# Patient Record
Sex: Male | Born: 1960 | Race: White | Hispanic: No | Marital: Married | Smoking: Current some day smoker
Health system: Southern US, Community
[De-identification: ages and names within clinical notes are randomized; demographics above are authoritative.]

## PROBLEM LIST (undated history)

## (undated) DIAGNOSIS — N2581 Secondary hyperparathyroidism of renal origin: Secondary | ICD-10-CM

## (undated) DIAGNOSIS — N189 Chronic kidney disease, unspecified: Secondary | ICD-10-CM

## (undated) DIAGNOSIS — I10 Essential (primary) hypertension: Secondary | ICD-10-CM

## (undated) DIAGNOSIS — S39840A Fracture of corpus cavernosum penis, initial encounter: Secondary | ICD-10-CM

## (undated) DIAGNOSIS — E875 Hyperkalemia: Secondary | ICD-10-CM

## (undated) DIAGNOSIS — Z87442 Personal history of urinary calculi: Secondary | ICD-10-CM

## (undated) DIAGNOSIS — E785 Hyperlipidemia, unspecified: Secondary | ICD-10-CM

## (undated) DIAGNOSIS — M311 Thrombotic microangiopathy, unspecified: Secondary | ICD-10-CM

## (undated) DIAGNOSIS — R0989 Other specified symptoms and signs involving the circulatory and respiratory systems: Secondary | ICD-10-CM

## (undated) DIAGNOSIS — Z992 Dependence on renal dialysis: Secondary | ICD-10-CM

## (undated) DIAGNOSIS — R801 Persistent proteinuria, unspecified: Secondary | ICD-10-CM

## (undated) DIAGNOSIS — K219 Gastro-esophageal reflux disease without esophagitis: Secondary | ICD-10-CM

## (undated) HISTORY — PX: COLONOSCOPY: SHX174

## (undated) HISTORY — PX: TONSILLECTOMY: SUR1361

## (undated) HISTORY — PX: OTHER SURGICAL HISTORY: SHX169

---

## 1987-12-10 DIAGNOSIS — S39840A Fracture of corpus cavernosum penis, initial encounter: Secondary | ICD-10-CM

## 1987-12-10 HISTORY — DX: Fracture of corpus cavernosum penis, initial encounter: S39.840A

## 2005-10-22 ENCOUNTER — Ambulatory Visit: Payer: Self-pay | Admitting: Family Medicine

## 2005-11-05 ENCOUNTER — Ambulatory Visit: Payer: Self-pay | Admitting: Family Medicine

## 2005-11-19 ENCOUNTER — Ambulatory Visit: Payer: Self-pay | Admitting: Family Medicine

## 2005-11-25 ENCOUNTER — Ambulatory Visit: Payer: Self-pay | Admitting: Internal Medicine

## 2006-02-26 ENCOUNTER — Ambulatory Visit: Payer: Self-pay | Admitting: Internal Medicine

## 2006-03-20 ENCOUNTER — Ambulatory Visit: Payer: Self-pay | Admitting: Internal Medicine

## 2008-10-21 ENCOUNTER — Telehealth: Payer: Self-pay | Admitting: Internal Medicine

## 2008-10-21 ENCOUNTER — Encounter (INDEPENDENT_AMBULATORY_CARE_PROVIDER_SITE_OTHER): Payer: Self-pay | Admitting: *Deleted

## 2008-10-28 DIAGNOSIS — I10 Essential (primary) hypertension: Secondary | ICD-10-CM

## 2008-10-31 DIAGNOSIS — I10 Essential (primary) hypertension: Secondary | ICD-10-CM | POA: Insufficient documentation

## 2010-03-28 ENCOUNTER — Emergency Department: Payer: Self-pay | Admitting: Emergency Medicine

## 2013-04-30 ENCOUNTER — Ambulatory Visit: Payer: Self-pay | Admitting: Unknown Physician Specialty

## 2013-05-05 LAB — PATHOLOGY REPORT

## 2014-12-15 ENCOUNTER — Ambulatory Visit: Payer: Self-pay | Admitting: Physician Assistant

## 2018-04-21 ENCOUNTER — Ambulatory Visit: Payer: Self-pay | Admitting: General Surgery

## 2018-04-21 NOTE — H&P (Signed)
PATIENT PROFILE: Ricky Simmons is a 57 y.o. male who presents to the Clinic for consultation at the request of Dr. Carrie Mew for evaluation of hemorrhoids.  PCP:  Sheron Nightingale, PA  HISTORY OF PRESENT ILLNESS: Ricky Simmons reports having hemorrhoids problem since 29. At that moment he had an episode of increased inflamed hemorrhoids, but improved. In the last year and a half patient re started with symptoms. Patient refers main symptoms is bleeding. Also refers pain, leakage of stool, and burning on area. Pain is on perianal area. Pain does not radiates to other part of the body. Pain has been improved with Preparation H. Pain is aggravated with bowel movement. Last colonoscopy 2014 with polyp removed. Patient has appointment to see GI for evaluation of colonoscopy. Even dough patient denies constipation, bowel movement is one of the aggravating factors.    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 Positive for weight loss Allergy and Immunology ROS: negative for - hives  Hematological and Lymphatic ROS: negative for - bleeding problems or bruising, negative for palpable nodes Endocrine ROS: negative for - heat or cold intolerance, hair changes Respiratory ROS: negative for - cough, shortness of breath or wheezing Cardiovascular ROS: no chest pain or palpitations GI ROS: negative for nausea, vomiting, abdominal pain, diarrhea, constipation. Positive for rectal bleeding.  Musculoskeletal ROS: negative for - joint swelling or muscle pain Neurological ROS: negative for - confusion, syncope Dermatological ROS: negative for pruritus and rash Psychiatric: negative for anxiety, depression, difficulty sleeping and memory  loss  MEDICATIONS: CurrentMedications        Current Outpatient Medications  Medication Sig Dispense Refill  . amLODIPine (NORVASC) 10 MG tablet Take 1 tablet (10 mg total) by mouth once daily 90 tablet 1  . carvedilol (COREG) 25 MG tablet Take 1 tablet (25 mg total) by mouth 2 (two) times daily with meals 180 tablet 1  . hydrALAZINE (APRESOLINE) 50 MG tablet Take 1 tablet (50 mg total) by mouth 2 (two) times daily 60 tablet 2  . losartan (COZAAR) 100 MG tablet Take 1 tablet (100 mg total) by mouth once daily 90 tablet 1   No current facility-administered medications for this visit.       ALLERGIES: Patient has no known allergies.  PAST MEDICAL HISTORY:     Past Medical History:  Diagnosis Date  . Hyperlipidemia   . Hypertension     PAST SURGICAL HISTORY:      Past Surgical History:  Procedure Laterality Date  . COLONOSCOPY  04/30/2013   Adenomatous Polyp: CBF 04/2018; Recall Ltr mailed 03/03/2018 (dh)     FAMILY HISTORY:      Family History  Problem Relation Age of Onset  . High blood pressure (Hypertension) Mother   . Rheum arthritis Mother   . High blood pressure (Hypertension) Father   . Myocardial Infarction (Heart attack) Father   . Coronary Artery Disease (Blocked arteries around heart) Father   . Diabetes type I Brother   . Migraines Brother      SOCIAL HISTORY: Social History          Socioeconomic History  . Marital status: Married    Spouse name:  Not on file  . Number of children: Not on file  . Years of education: Not on file  . Highest education level: Not on file  Occupational History  . Not on file  Social Needs  . Financial resource strain: Not on file  . Food insecurity:    Worry: Not on file    Inability: Not on file  . Transportation needs:    Medical: Not on file    Non-medical: Not on file  Tobacco Use  . Smoking status: Current Every Day Smoker    Packs/day: 0.75    Types: Cigarettes  .  Smokeless tobacco: Never Used  Substance and Sexual Activity  . Alcohol use: No    Alcohol/week: 0.0 oz  . Drug use: No  . Sexual activity: Defer  Other Topics Concern  . Not on file  Social History Narrative   Married x 8yr., has two adult step-sons, one Daughter died in 956(trisomy18), and 187yo at home.        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  314yrof college.      No alcohol and smokes 1 ppd x 3015yr     PHYSICAL EXAM:    Vitals:   04/21/18 0911  BP: 126/77  Pulse: 80  Temp: 36.4 C (97.6 F)   Body mass index is 22.04 kg/m. Weight: 78.9 kg (174 lb)   GENERAL: Alert, active, oriented x3  HEENT: Pupils equal reactive to light. Extraocular movements are intact. Sclera clear. Palpebral conjunctiva normal red color.Pharynx clear.  NECK: Supple with no palpable mass and no adenopathy.  LUNGS: Sound clear with no rales rhonchi or wheezes.  HEART: Regular rhythm S1 and S2 without murmur.  ABDOMEN: Soft and depressible, nontender with no palpable mass, no hepatomegaly. See anoscopy report.   RECTAL: adequate rectal tone. No masses, palpable hemorrhoids. No fissures.   EXTREMITIES: Well-developed well-nourished symmetrical with no dependent edema.  NEUROLOGICAL: Awake alert oriented, facial expression symmetrical, moving all extremities.  REVIEW OF DATA: I have reviewed the following data today:      Office Visit on 03/02/2018  Component Date Value  . Hemoglobin A1C 03/02/2018 5.5   . Average Blood Glucose (C* 03/02/2018 111   . Cholesterol, Total 03/02/2018 160   . Triglyceride 03/02/2018 132   . HDL (High Density Lipopr* 03/02/2018 41.3   . LDL (Low Density Lipopro* 03/02/2018 92   . VLDL Cholesterol 03/02/2018 26   . Cholesterol/HDL Ratio 03/02/2018 3.9   . Glucose 03/02/2018 92   . Sodium 03/02/2018 141   . Potassium 03/02/2018 4.7   . Chloride  03/02/2018 110*  . Carbon Dioxide (CO2) 03/02/2018 24.2   . Urea Nitrogen (BUN) 03/02/2018 27*  . Creatinine 03/02/2018 1.7*  . Glomerular Filtration Ra* 03/02/2018 42*  . Calcium 03/02/2018 8.7   . AST  03/02/2018 11   . ALT  03/02/2018 8   . Alk Phos (alkaline Phosp* 03/02/2018 45   . Albumin 03/02/2018 3.9   . Bilirubin, Total 03/02/2018 0.4   . Protein, Total 03/02/2018 6.4   . A/G Ratio 03/02/2018 1.6   . Color 03/02/2018 Yellow   . Clarity 03/02/2018 Clear   . Specific Gravity 03/02/2018 >=1.030   . pH, Urine 03/02/2018 5.5   . Protein, Urinalysis 03/02/2018 100 *  . Glucose, Urinalysis 03/02/2018 Negative   . Ketones, Urinalysis 03/02/2018 Negative   . Blood, Urinalysis  03/02/2018 Negative   . Nitrite, Urinalysis 03/02/2018 Negative   . Leukocyte Esterase, Urin* 03/02/2018 Negative   . White Blood Cells, Urina* 03/02/2018 0-3   . Red Blood Cells, Urinaly* 03/02/2018 None Seen   . Bacteria, Urinalysis 03/02/2018 None Seen   . Squamous Epithelial Cell* 03/02/2018 None Seen   . Thyroid Stimulating Horm* 03/02/2018 2.093   . WBC (White Blood Cell Co* 03/02/2018 8.7   . RBC (Red Blood Cell Coun* 03/02/2018 4.18*  . Hemoglobin 03/02/2018 12.8*  . Hematocrit 03/02/2018 40.7   . MCV (Mean Corpuscular Vo* 03/02/2018 97.4   . MCH (Mean Corpuscular He* 03/02/2018 30.6   . MCHC (Mean Corpuscular H* 03/02/2018 31.4*  . Platelet Count 03/02/2018 185   . RDW-CV (Red Cell Distrib* 03/02/2018 14.0   . MPV (Mean Platelet Volum* 03/02/2018 10.2   . Neutrophils 03/02/2018 5.12   . Lymphocytes 03/02/2018 2.22   . Monocytes 03/02/2018 0.81   . Eosinophils 03/02/2018 0.49   . Basophils 03/02/2018 0.06   . Neutrophil % 03/02/2018 58.8   . Lymphocyte % 03/02/2018 25.5   . Monocyte % 03/02/2018 9.3   . Eosinophil % 03/02/2018 5.6*  . Basophil% 03/02/2018 0.7   . Immature Granulocyte % 03/02/2018 0.1   . Immature Granulocyte Cou* 03/02/2018 0.01     ASSESSMENT: Ricky Simmons is a 57  y.o. male presenting for consultation for Hemorrhoids.    Patient was found on physical exam with finding of internal and external inflamed hemorrhoids. Patient oriented about the diagnosis of hemorrhoids, its function and anatomy. The different treatment alternativeswere discussed (conservative management, office procedures and surgical treatment). Patient was oriented of why it should be started with conservative management (fiber supplement, water intake).  Patient was oriented that the hemorrhoids are a matter of quality of life. Since patient is having significant bleeding from hemorrhoids, pain and has component of both internal and external, surgical hemorrhoidectomy was discussed with patient. Patient refers he prefer to have hemorrhoidectomy since the bleeding is affecting him to much to wait for the to see if fiber and water will decrease the bleeding. Since patient has both, external and internal component, hemorrhoidectomy is a better choice to treat than just banding of the internal hemorrhoids. Patient has to be aware of how much the hemorrhoids are affecting (pain?, bleeding?, discomfort?, prolapse?, leakage?) the quality of life to receive a benefit from a more invasive procedure. If current symptoms does not improve with conservative management discussed with patient, invasive alternatives will be discussed as next step.   PLAN: 1. Internal and external hemorrhoidectomy (46260) 2. CBC, CMP 3. Internal Medicine clearance - Carrie Mew, Miriam 4. Do not take aspirin 5 days before surgery  5. Increase fiber supplementation on diet 6. Adequate fluid intake (6-8 glasses of water a day) 7. Avoid spicy and fatty food 8. Warm baths (Sitz bath) 2 or 3 times a day may help with symptoms 9. Avoid sitting on toilet for a prolonged time 10. Go to the bathroom only when have the urge to have the bowel movement 11. Topical steroids may be used for the relieve of pruritus and inflammation 12.  Avoid constipation or diarrhea (may use stool softener)  Patient verbalized understanding, all questions were answered, and were agreeable with the plan outlined above.   Herbert Pun, MD  Electronically signed by Herbert Pun, MD

## 2018-04-21 NOTE — H&P (View-Only) (Signed)
PATIENT PROFILE: Ricky Simmons is a 57 y.o. male who presents to the Clinic for consultation at the request of Dr. Carrie Mew for evaluation of hemorrhoids.  PCP:  Sheron Nightingale, PA  HISTORY OF PRESENT ILLNESS: [name] reports having hemorrhoids problem since 53. At that moment he had an episode of increased inflamed hemorrhoids, but improved. In the last year and a half patient re started with symptoms. Patient refers main symptoms is bleeding. Also refers pain, leakage of stool, and burning on area. Pain is on perianal area. Pain does not radiates to other part of the body. Pain has been improved with Preparation H. Pain is aggravated with bowel movement. Last colonoscopy 2014 with polyp removed. Patient has appointment to see GI for evaluation of colonoscopy. Even dough patient denies constipation, bowel movement is one of the aggravating factors.    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 Positive for weight loss Allergy and Immunology ROS: negative for - hives  Hematological and Lymphatic ROS: negative for - bleeding problems or bruising, negative for palpable nodes Endocrine ROS: negative for - heat or cold intolerance, hair changes Respiratory ROS: negative for - cough, shortness of breath or wheezing Cardiovascular ROS: no chest pain or palpitations GI ROS: negative for nausea, vomiting, abdominal pain, diarrhea, constipation. Positive for rectal bleeding.  Musculoskeletal ROS: negative for - joint swelling or muscle pain Neurological ROS: negative for - confusion, syncope Dermatological ROS: negative for pruritus and rash Psychiatric: negative for anxiety, depression, difficulty sleeping and memory  loss  MEDICATIONS: CurrentMedications        Current Outpatient Medications  Medication Sig Dispense Refill  . amLODIPine (NORVASC) 10 MG tablet Take 1 tablet (10 mg total) by mouth once daily 90 tablet 1  . carvedilol (COREG) 25 MG tablet Take 1 tablet (25 mg total) by mouth 2 (two) times daily with meals 180 tablet 1  . hydrALAZINE (APRESOLINE) 50 MG tablet Take 1 tablet (50 mg total) by mouth 2 (two) times daily 60 tablet 2  . losartan (COZAAR) 100 MG tablet Take 1 tablet (100 mg total) by mouth once daily 90 tablet 1   No current facility-administered medications for this visit.       ALLERGIES: Patient has no known allergies.  PAST MEDICAL HISTORY:     Past Medical History:  Diagnosis Date  . Hyperlipidemia   . Hypertension     PAST SURGICAL HISTORY:      Past Surgical History:  Procedure Laterality Date  . COLONOSCOPY  04/30/2013   Adenomatous Polyp: CBF 04/2018; Recall Ltr mailed 03/03/2018 (dh)     FAMILY HISTORY:      Family History  Problem Relation Age of Onset  . High blood pressure (Hypertension) Mother   . Rheum arthritis Mother   . High blood pressure (Hypertension) Father   . Myocardial Infarction (Heart attack) Father   . Coronary Artery Disease (Blocked arteries around heart) Father   . Diabetes type I Brother   . Migraines Brother      SOCIAL HISTORY: Social History          Socioeconomic History  . Marital status: Married    Spouse name:  Not on file  . Number of children: Not on file  . Years of education: Not on file  . Highest education level: Not on file  Occupational History  . Not on file  Social Needs  . Financial resource strain: Not on file  . Food insecurity:    Worry: Not on file    Inability: Not on file  . Transportation needs:    Medical: Not on file    Non-medical: Not on file  Tobacco Use  . Smoking status: Current Every Day Smoker    Packs/day: 0.75    Types: Cigarettes  .  Smokeless tobacco: Never Used  Substance and Sexual Activity  . Alcohol use: No    Alcohol/week: 0.0 oz  . Drug use: No  . Sexual activity: Defer  Other Topics Concern  . Not on file  Social History Narrative   Married x 61yr., has two adult step-sons, one Daughter died in 924(trisomy18), and 196yo at home.        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  380yrof college.      No alcohol and smokes 1 ppd x 3074yr     PHYSICAL EXAM:    Vitals:   04/21/18 0911  BP: 126/77  Pulse: 80  Temp: 36.4 C (97.6 F)   Body mass index is 22.04 kg/m. Weight: 78.9 kg (174 lb)   GENERAL: Alert, active, oriented x3  HEENT: Pupils equal reactive to light. Extraocular movements are intact. Sclera clear. Palpebral conjunctiva normal red color.Pharynx clear.  NECK: Supple with no palpable mass and no adenopathy.  LUNGS: Sound clear with no rales rhonchi or wheezes.  HEART: Regular rhythm S1 and S2 without murmur.  ABDOMEN: Soft and depressible, nontender with no palpable mass, no hepatomegaly. See anoscopy report.   RECTAL: adequate rectal tone. No masses, palpable hemorrhoids. No fissures.   EXTREMITIES: Well-developed well-nourished symmetrical with no dependent edema.  NEUROLOGICAL: Awake alert oriented, facial expression symmetrical, moving all extremities.  REVIEW OF DATA: I have reviewed the following data today:      Office Visit on 03/02/2018  Component Date Value  . Hemoglobin A1C 03/02/2018 5.5   . Average Blood Glucose (C* 03/02/2018 111   . Cholesterol, Total 03/02/2018 160   . Triglyceride 03/02/2018 132   . HDL (High Density Lipopr* 03/02/2018 41.3   . LDL (Low Density Lipopro* 03/02/2018 92   . VLDL Cholesterol 03/02/2018 26   . Cholesterol/HDL Ratio 03/02/2018 3.9   . Glucose 03/02/2018 92   . Sodium 03/02/2018 141   . Potassium 03/02/2018 4.7   . Chloride  03/02/2018 110*  . Carbon Dioxide (CO2) 03/02/2018 24.2   . Urea Nitrogen (BUN) 03/02/2018 27*  . Creatinine 03/02/2018 1.7*  . Glomerular Filtration Ra* 03/02/2018 42*  . Calcium 03/02/2018 8.7   . AST  03/02/2018 11   . ALT  03/02/2018 8   . Alk Phos (alkaline Phosp* 03/02/2018 45   . Albumin 03/02/2018 3.9   . Bilirubin, Total 03/02/2018 0.4   . Protein, Total 03/02/2018 6.4   . A/G Ratio 03/02/2018 1.6   . Color 03/02/2018 Yellow   . Clarity 03/02/2018 Clear   . Specific Gravity 03/02/2018 >=1.030   . pH, Urine 03/02/2018 5.5   . Protein, Urinalysis 03/02/2018 100 *  . Glucose, Urinalysis 03/02/2018 Negative   . Ketones, Urinalysis 03/02/2018 Negative   . Blood, Urinalysis  03/02/2018 Negative   . Nitrite, Urinalysis 03/02/2018 Negative   . Leukocyte Esterase, Urin* 03/02/2018 Negative   . White Blood Cells, Urina* 03/02/2018 0-3   . Red Blood Cells, Urinaly* 03/02/2018 None Seen   . Bacteria, Urinalysis 03/02/2018 None Seen   . Squamous Epithelial Cell* 03/02/2018 None Seen   . Thyroid Stimulating Horm* 03/02/2018 2.093   . WBC (White Blood Cell Co* 03/02/2018 8.7   . RBC (Red Blood Cell Coun* 03/02/2018 4.18*  . Hemoglobin 03/02/2018 12.8*  . Hematocrit 03/02/2018 40.7   . MCV (Mean Corpuscular Vo* 03/02/2018 97.4   . MCH (Mean Corpuscular He* 03/02/2018 30.6   . MCHC (Mean Corpuscular H* 03/02/2018 31.4*  . Platelet Count 03/02/2018 185   . RDW-CV (Red Cell Distrib* 03/02/2018 14.0   . MPV (Mean Platelet Volum* 03/02/2018 10.2   . Neutrophils 03/02/2018 5.12   . Lymphocytes 03/02/2018 2.22   . Monocytes 03/02/2018 0.81   . Eosinophils 03/02/2018 0.49   . Basophils 03/02/2018 0.06   . Neutrophil % 03/02/2018 58.8   . Lymphocyte % 03/02/2018 25.5   . Monocyte % 03/02/2018 9.3   . Eosinophil % 03/02/2018 5.6*  . Basophil% 03/02/2018 0.7   . Immature Granulocyte % 03/02/2018 0.1   . Immature Granulocyte Cou* 03/02/2018 0.01     ASSESSMENT: Mr. Havey is a 57  y.o. male presenting for consultation for Hemorrhoids.    Patient was found on physical exam with finding of internal and external inflamed hemorrhoids. Patient oriented about the diagnosis of hemorrhoids, its function and anatomy. The different treatment alternativeswere discussed (conservative management, office procedures and surgical treatment). Patient was oriented of why it should be started with conservative management (fiber supplement, water intake).  Patient was oriented that the hemorrhoids are a matter of quality of life. Since patient is having significant bleeding from hemorrhoids, pain and has component of both internal and external, surgical hemorrhoidectomy was discussed with patient. Patient refers he prefer to have hemorrhoidectomy since the bleeding is affecting him to much to wait for the to see if fiber and water will decrease the bleeding. Since patient has both, external and internal component, hemorrhoidectomy is a better choice to treat than just banding of the internal hemorrhoids. Patient has to be aware of how much the hemorrhoids are affecting (pain?, bleeding?, discomfort?, prolapse?, leakage?) the quality of life to receive a benefit from a more invasive procedure. If current symptoms does not improve with conservative management discussed with patient, invasive alternatives will be discussed as next step.   PLAN: 1. Internal and external hemorrhoidectomy (46260) 2. CBC, CMP 3. Internal Medicine clearance - Carrie Mew, Miriam 4. Do not take aspirin 5 days before surgery  5. Increase fiber supplementation on diet 6. Adequate fluid intake (6-8 glasses of water a day) 7. Avoid spicy and fatty food 8. Warm baths (Sitz bath) 2 or 3 times a day may help with symptoms 9. Avoid sitting on toilet for a prolonged time 10. Go to the bathroom only when have the urge to have the bowel movement 11. Topical steroids may be used for the relieve of pruritus and inflammation 12.  Avoid constipation or diarrhea (may use stool softener)  Patient verbalized understanding, all questions were answered, and were agreeable with the plan outlined above.   Herbert Pun, MD  Electronically signed by Herbert Pun, MD

## 2018-04-29 ENCOUNTER — Other Ambulatory Visit: Payer: Self-pay

## 2018-04-29 ENCOUNTER — Encounter
Admission: RE | Admit: 2018-04-29 | Discharge: 2018-04-29 | Disposition: A | Discharge status: Home / Self Care | Payer: Managed Care, Other (non HMO) | Source: Ambulatory Visit | Attending: General Surgery | Admitting: General Surgery

## 2018-04-29 DIAGNOSIS — Z01810 Encounter for preprocedural cardiovascular examination: Secondary | ICD-10-CM | POA: Diagnosis not present

## 2018-04-29 HISTORY — DX: Personal history of urinary calculi: Z87.442

## 2018-04-29 HISTORY — DX: Essential (primary) hypertension: I10

## 2018-04-29 HISTORY — DX: Gastro-esophageal reflux disease without esophagitis: K21.9

## 2018-04-29 NOTE — Patient Instructions (Signed)
Your procedure is scheduled on: 05-07-18 THURSDAY Report to Same Day Surgery 2nd floor medical mall [facility] Entrance-take elevator on left to 2nd floor.  Check in with surgery information desk.) To find out your arrival time please call [phone] between 1PM - 3PM on 05-06-18 [facility]  Remember: Instructions that are not followed completely may result in serious medical risk, up to and including death, or upon the discretion of your surgeon and anesthesiologist your surgery may need to be rescheduled.    _x___ 1. Do not eat food after midnight the night before your procedure. NO GUM OR CANDY AFTER MIDNIGHT.  You may drink clear liquids up to 2 hours before you are scheduled to arrive at the hospital for your procedure.  Do not drink clear liquids within 2 hours of your scheduled arrival to the hospital.  Clear liquids include  --Water or Apple juice without pulp  --Clear carbohydrate beverage such as ClearFast or Gatorade  --Black Coffee or Clear Tea (No milk, no creamers, do not add anything to the coffee or Tea     __x__ 2. No Alcohol for 24 hours before or after surgery.   __x__3. No Smoking or e-cigarettes for 24 prior to surgery.  Do not use any chewable tobacco products for at least 6 hour prior to surgery   ____  4. Bring all medications with you on the day of surgery if instructed.    __x__ 5. Notify your doctor if there is any change in your medical condition     (cold, fever, infections).    x___6. On the morning of surgery brush your teeth with toothpaste and water.  You may rinse your mouth with mouth wash if you wish.  Do not swallow any toothpaste or mouthwash.   Do not wear jewelry, make-up, hairpins, clips or nail polish.  Do not wear lotions, powders, or perfumes. You may wear deodorant.  Do not shave 48 hours prior to surgery. Men may shave face and neck.  Do not bring valuables to the hospital.    [facility] is not responsible for any belongings or  valuables.               Contacts, dentures or bridgework may not be worn into surgery.  Leave your suitcase in the car. After surgery it may be brought to your room.  For patients admitted to the hospital, discharge time is determined by your treatment team.  _  Patients discharged the day of surgery will not be allowed to drive home.  You will need someone to drive you home and stay with you the night of your procedure.    Please read over the following fact sheets that you were given:   [facility] Preparing for Surgery and or MRSA Information   _x___ TAKE THE FOLLOWING MEDICATION THE MORNING OF SURGERY . These include:  1. AMLODIPINE (NORVASC)  2. COREG (CARVEDILOL)  3. HYDRALAZINE (APRESOLINE)  4.  5.  6.  ____Fleets enema or Magnesium Citrate as directed.   ____ Use CHG Soap or sage wipes as directed on instruction sheet   ____ Use inhalers on the day of surgery and bring to hospital day of surgery  ____ Stop Metformin and Janumet 2 days prior to surgery.    ____ Take 1/2 of usual insulin dose the night before surgery and none on the morning surgery.   ____ Follow recommendations from Cardiologist, Pulmonologist or PCP regarding stopping Aspirin, Coumadin, Plavix ,Eliquis, Effient, or Pradaxa, and  Pletal.  X____Stop Anti-inflammatories such as Advil, Aleve, Ibuprofen, Motrin, Naproxen, Naprosyn, Goodies powders or aspirin products NOW-OK to take Tylenol   ____ Stop supplements until after surgery.    ____ Bring C-Pap to the hospital.

## 2018-04-30 ENCOUNTER — Other Ambulatory Visit: Payer: Self-pay

## 2018-04-30 ENCOUNTER — Encounter
Admission: RE | Admit: 2018-04-30 | Discharge: 2018-04-30 | Disposition: A | Discharge status: Home / Self Care | Payer: Managed Care, Other (non HMO) | Source: Ambulatory Visit | Attending: General Surgery | Admitting: General Surgery

## 2018-05-06 ENCOUNTER — Encounter: Payer: Self-pay | Admitting: *Deleted

## 2018-05-06 MED ORDER — CIPROFLOXACIN IN D5W 400 MG/200ML IV SOLN
400.0000 mg | INTRAVENOUS | Status: AC
  Administered 2018-05-07: 400 mg via INTRAVENOUS

## 2018-05-06 MED ORDER — METRONIDAZOLE IN NACL 5-0.79 MG/ML-% IV SOLN
500.0000 mg | INTRAVENOUS | Status: AC
  Administered 2018-05-07: 500 mg via INTRAVENOUS
  Filled 2018-05-06: qty 100

## 2018-05-06 NOTE — Pre-Procedure Instructions (Signed)
Progress Notes - documented in this encounter  Sheron Nightingale, [place] - 03/02/2018 1:30 PM EDT Formatting of this note might be different from the original. Ricky Simmons is a 57 y.o. male here for reck patient visit to discuss: Chief Complaint  Patient presents with  . Follow-up  blood pressure   History of Present Illness:  57 yo male back for reck.   Hypertension- Has been off Amlodipine and not taking Carvedilol twice daily,now just taking once daily for unclear reasons.   Weight loss- Has lost weight intentionally. Down 12 lbs since last visit. Says mood is stable. Not exercising.   Rectal bleeding- would like eval of hemorrhoids. Says he has large volume bleeding even without BM. This will soak his pants at times. Says he has large hemorrhoids. Has prior hx/o adenomatous polyp on colonoscopy.   Elevated Creatinine- not checked recently.  Past Medical History:   Past Medical History:  Diagnosis Date  . Hyperlipidemia, unspecified  . Hypertension   Past Surgical History:   Past Surgical History:  Procedure Laterality Date  . COLONOSCOPY 04/30/13  Adenomatous polyp   Allergies:  No Known Allergies  Current Medications:   Prior to Admission medications  Medication Sig Taking? Last Dose  amLODIPine (NORVASC) 10 MG tablet Take 1 tablet (10 mg total) by mouth once daily. Yes Taking  carvedilol (COREG) 25 MG tablet Take 1 tablet (25 mg total) by mouth 2 (two) times daily with meals Yes Taking  hydrALAZINE (APRESOLINE) 50 MG tablet Take 1 tablet (50 mg total) by mouth 2 (two) times daily. Yes Taking  losartan (COZAAR) 100 MG tablet Take 1 tablet (100 mg total) by mouth once daily Yes Taking   Family History:   Family History  Problem Relation Age of Onset  . High blood pressure (Hypertension) Mother  . Rheum arthritis Mother  . High blood pressure (Hypertension) Father  . Myocardial Infarction (Heart attack) Father  . Coronary Artery Disease (Blocked  arteries around heart) Father  . Diabetes type I Brother  . Migraines Brother   Social History:   Social History   Socioeconomic History  . Marital status: Married  Spouse name: Not on file  . Number of children: Not on file  . Years of education: Not on file  . Highest education level: Not on file  Occupational History  . Not on file  Social Needs  . Financial resource strain: Not on file  . Food insecurity:  Worry: Not on file  Inability: Not on file  . Transportation needs:  Medical: Not on file  Non-medical: Not on file  Tobacco Use  . Smoking status: Current Every Day Smoker  Packs/day: 0.75  Types: Cigarettes  . Smokeless tobacco: Never Used  Substance and Sexual Activity  . Alcohol use: No  Alcohol/week: 0.0 oz  . Drug use: No  . Sexual activity: Defer  Lifestyle  . Physical activity:  Days per week: Not on file  Minutes per session: Not on file  . Stress: Not on file  Relationships  . Social connections:  Talks on phone: Not on file  Gets together: Not on file  Attends religious service: Not on file  Active member of club or organization: Not on file  Attends meetings of clubs or organizations: Not on file  Relationship status: Not on file  Other Topics Concern  . Not on file  Social History Narrative  Married x 4yr., has two adult step-sons, one Daughter died in [phone]([id number]), and 169  yo at home.   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 HS and college athlete, baseball. 38yr of college.   No alcohol and smokes 1 ppd x 372yr   Review of Systems:   Per HPI  Vitals:   Vitals:  03/02/18 1326  BP: (!) 178/102  BP Location: Right upper arm  Patient Position: Sitting  BP Cuff Size: Adult  Pulse: 69  SpO2: 97%  Weight: 79.1 kg (174 lb 6.4 oz)  Height: 189.2 cm (6' 2.5")   Body mass index is 22.09 kg/m.  Physical Exam:   Wt Readings from Last 3 Encounters:  03/02/18 79.1 kg (174 lb 6.4 oz)    07/24/17 84.4 kg (186 lb)  06/20/17 81.5 kg (179 lb 9.6 oz)   Assessment and Plan:   1. Hypertension, essential Significantly elevated. Restart medications as prescribed. Will send refills.   2. Adenomatous polyp Now with rectal bleeding and could be related to hemorrhoids vs. Other cause. Refer to GI for consideration of colonoscopy.  - Ambulatory Referral to Gastroenterology  3. Prediabetes Repeat labs.  - Hemoglobin A1C - Lipid Panel w/calc LDL  4. Mixed hyperlipidemia Check labs.   5. Rectal bleeding Refer to both GI and surgery, check cbc to evaluate.  - Ambulatory Referral to Gastroenterology - Ambulatory Referral to General Surgery  6. Hemorrhoids, internal, with bleeding - Ambulatory Referral to General Surgery  7. Essential hypertension - Comprehensive Metabolic Panel (CMP) - Thyroid Stimulating-Hormone (TSH) - CBC w/auto Differential (5 Part)  8. CKD (chronic kidney disease) stage 3, GFR 30-59 ml/min (CMS-HCC) - Comprehensive Metabolic Panel (CMP) - Urinalysis w/Microscopic  Will need close follow up with issues and weight loss. follow up in 4-6 weeks for recheck.  MiPaulita CradlePA-C  Patient received an After Visit Summary     Electronically signed by McSheron NightingalePACumminsvillet 03/05/2018 5:54 PM EDT     Plan of Treatment - documented as of this encounter  Upcoming Encounters Upcoming Encounters  Date Type Specialty Care Team Description  05/21/2018 [place] MillEd[place]ia[place] [phone])    07/27/2018 Office Visit Internal Medicine McPaulita CradlelPlainfieldPAOliver Springs[place]reeleyKe[place]ville[place] [phone])     Scheduled Referrals Scheduled Referrals  Name Type Priority Associated Diagnoses Order Schedule  Ambulatory Referral to Gastroenterology Outpatient Referral  Routine Adenomatous polyp  Rectal bleeding  Ordered: 03/02/2018  Ambulatory Referral to General Surgery Outpatient Referral ASAP Rectal bleeding  Hemorrhoids, internal, with bleeding  Ordered: 03/02/2018   Goals - documented as of this encounter  Goal Patient Goal Type Associated Problems Recent Progress Patient-Stated? AuChief Strategy OfficerStop E-Cigarettes  General   Yes EvYvonne KendallCMA  Reduce Blood Pressure Elevation  General   Yes EvYvonne KendallCMA  Take My Medications Properly  General   Yes EvYvonne KendallCMA   Procedures - documented in this encounter  Procedure Name Priority Date/Time Associated Diagnosis Comments  CBC W/AUTO DIFFERENTIAL (5 PART DIFF) -[facility], [facility] Routine 03/02/2018 2:16 PM EDT Essential hypertension  Results for this procedure are in the results section.   TSH (THYROID STIMULATING HORMONE) - [facility]FFIL[facility], [facility] Routine 03/02/2018 2:16 PM EDT Essential hypertension  Results for this procedure are in the results section.   HEMOGLOBIN A1C - [facility]IL[facility], [facility] Routine 03/02/2018 2:16 PM  EDT Prediabetes  Results for this procedure are in the results section.   URINALYSIS W/MICROSCOPIC - [facility], [facility] Routine 03/02/2018 2:16 PM EDT CKD (chronic kidney disease) stage 3, GFR 30-59 ml/min (CMS-HCC)  Results for this procedure are in the results section.   LIPID PANEL W/CALC LDL - [facility] AFFILIATE, [facility] Routine 03/02/2018 2:16 PM EDT Prediabetes  Results for this procedure are in the results section.   COMPREHENSIVE METABOLIC PANEL (CMP) - [facility] AFFILIATE, [facility] Routine 03/02/2018 2:16 PM EDT Essential hypertension  CKD (chronic kidney disease) stage 3, GFR 30-59 ml/min (CMS-HCC)  Results for this procedure are in the results section.    Lab Results - documented in this encounter  Table of Contents for Lab Results  CBC w/auto Differential (5 Part) (03/02/2018 2:16 PM EDT)  Thyroid Stimulating-Hormone (TSH)  (03/02/2018 2:16 PM EDT)  Urinalysis w/Microscopic (03/02/2018 2:16 PM EDT)  Comprehensive Metabolic Panel (CMP) (68/61/6837 2:16 PM EDT)  Lipid Panel w/calc LDL (03/02/2018 2:16 PM EDT)  Hemoglobin A1C (03/02/2018 2:16 PM EDT)     CBC w/auto Differential (5 Part) (03/02/2018 2:16 PM EDT) CBC w/auto Differential (5 Part) (03/02/2018 2:16 PM EDT)  Component Value Ref Range Performed At Pathologist Signature  WBC (White Blood Cell Count) 8.7 4.1 - 10.2 10^3/uL [place] - LAB   RBC (Red Blood Cell Count) 4.18 (L) 4.69 - 6.13 10^6/uL [facility]   Hemoglobin 12.8 (L) 14.1 - 18.1 gm/dL [facility]   Hematocrit 40.7 40.0 - 52.0 % [facility]   MCV (Mean Corpuscular Volume) 97.4 80.0 - 100.0 fl [place] - [facility]   MCH (Mean Corpuscular Hemoglobin) 30.6 27.0 - 31.2 pg [facility]   MCHC (Mean Corpuscular Hemoglobin Concentration) 31.4 (L) 32.0 - 36.0 gm/dL [facility]   Platelet Count 185 150 - 450 10^3/uL [place] - [facility]   RDW-CV (Red Cell Distribution Width) 14.0 11.6 - 14.8 % [facility]   MPV (Mean Platelet Volume) 10.2 9.4 - 12.4 fl [facility]   Neutrophils 5.12 1.50 - 7.80 10^3/uL [place] - LAB   Lymphocytes 2.22 1.00 - 3.60 10^3/uL [facility]   Monocytes 0.81 0.00 - 1.50 10^3/uL [facility]   Eosinophils 0.49 0.00 - 0.55 10^3/uL [facility]   Basophils 0.06 0.00 - 0.09 10^3/uL [facility]   Neutrophil % 58.8 32.0 - 70.0 % [facility]   Lymphocyte % 25.5 10.0 - 50.0 % [facility]   Monocyte % 9.3 4.0 - 13.0 % [facility]   Eosinophil % 5.6 (H) 1.0 - 5.0 % [facility]   Basophil% 0.7 0.0 - 2.0 % [facility]   Immature Granulocyte % 0.1 <=0.7 % [facility]   Immature  Granulocyte Count 0.01 <=0.06 10^3/L [place] - LAB    CBC w/auto Differential (5 Part) (03/02/2018 2:16 PM EDT)  Specimen  Blood   CBC w/auto Differential (5 Part) (03/02/2018 2:16 PM EDT)  Performing Organization Address City/State/Zipcode Phone Number  [facility] - LAB  [place], [place] [address]    Back to top of Lab Results    Thyroid Stimulating-Hormone (TSH) (03/02/2018 2:16 PM EDT) Thyroid Stimulating-Hormone (TSH) (03/02/2018  2:16 PM EDT)  Component Value Ref Range Performed At Pathologist Signature  Thyroid Stimulating Hormone (TSH) 2.093 0.450-5.330 uIU/ml uIU/mL [place] - LAB    Thyroid Stimulating-Hormone (TSH) (03/02/2018 2:16 PM EDT)  Specimen  Blood   Thyroid Stimulating-Hormone (TSH) (03/02/2018 2:16 PM EDT)  Performing Organization Address City/State/Zipcode Phone Number  [place]  [place], [place] [address]    Back to top of Lab Results    Urinalysis w/Microscopic (03/02/2018 2:16 PM EDT) Urinalysis w/Microscopic (03/02/2018 2:16 PM EDT)  Component Value Ref Range Performed At Pathologist Signature  Color Yellow Yellow [place] - LAB   Specific Gravity >=1.030 1.000 - 1.030 [place] - LAB   pH, Urine 5.5 5.0 - 8.0 [place] - LAB   Protein, Urinalysis 100  (A) Negative, Trace mg/dL [place] - LAB   Glucose, Urinalysis Negative Negative mg/dL [place] - LAB   Ketones, Urinalysis Negative Negative mg/dL [facility]   Blood, Urinalysis Negative Negative [facility]   Nitrite, Urinalysis Negative Negative [place] - LAB   Leukocyte Esterase, Urinalysis Negative Negative [place] - LAB   White Blood Cells, Urinalysis 0-3 None Seen, 0-3 /hpf [place] - LAB   Red Blood Cells,  Urinalysis None Seen None Seen, 0-3 /hpf [place] - LAB   Bacteria, Urinalysis None Seen None Seen /hpf [place] - LAB   Squamous Epithelial Cells, Urinalysis None Seen Rare, Few, None Seen /hpf [place] - LAB    Urinalysis w/Microscopic (03/02/2018 2:16 PM EDT)  Specimen  Urine   Urinalysis w/Microscopic (03/02/2018 2:16 PM EDT)  Performing Organization Address City/State/Zipcode Phone Number  [facility] - LAB  [place], [place] [address]    Back to top of Lab Results    Comprehensive Metabolic Panel (CMP) (81/77/1165 2:16 PM EDT) Comprehensive Metabolic Panel (CMP) (79/02/8332 2:16 PM EDT)  Component Value Ref Range Performed At Pathologist Signature  Glucose 92 70 - 110 mg/dL [facility]   Sodium 141 136 - 145 mmol/L [facility]   Potassium 4.7 3.6 - 5.1 mmol/L [facility]   Chloride 110 (H) 97 - 109 mmol/L [facility]   Carbon Dioxide (CO2) 24.2 22.0 - 32.0 mmol/L [facility]   Urea Nitrogen (BUN) 27 (H) 7 - 25 mg/dL [facility]   Creatinine 1.7 (H) 0.7 - 1.3 mg/dL [place] - LAB   Glomerular Filtration Rate (eGFR), MDRD Estimate 42 (L) >60 mL/min/1.73sq m [facility]   Calcium 8.7 8.7 - 10.3 mg/dL [place] - LAB   AST  11 8 - 39 U/L [facility]   ALT  8 6 - 57 U/L [facility]   Alk Phos (alkaline Phosphatase) 45 34 - 104 U/L [facility]   Albumin 3.9 3.5 - 4.8 g/dL [facility]   Bilirubin, Total 0.4 0.3 - 1.2 mg/dL [facility]   Protein, Total 6.4 6.1 - 7.9 g/dL [facility]   A/G Ratio 1.6 1.0 - 5.0 gm/dL [facility]    Comprehensive Metabolic  Panel (CMP) (03/02/2018 2:16 PM EDT)  Specimen  Blood   Comprehensive Metabolic Panel (CMP) (10/02/8526 2:16 PM  EDT)  Performing Organization Address City/State/Zipcode Phone Number  [place]  [place], [place] [address]    Back to top of Lab Results    Lipid Panel w/calc LDL (03/02/2018 2:16 PM EDT) Lipid Panel w/calc LDL (03/02/2018 2:16 PM EDT)  Component Value Ref Range Performed At Pathologist Signature  Cholesterol, Total 160 100 - 200 mg/dL [place] - LAB   Triglyceride 132 35 - 199 mg/dL [place] - LAB   HDL (High Density Lipoprotein) Cholesterol 41.3 29.0 - 71.0 mg/dL [facility]   LDL (Low Density Lipoprotien), Calculated 92 0 - 130 mg/dL [facility]   VLDL Cholesterol 26 mg/dL [facility]   Cholesterol/HDL Ratio 3.9  [place] - LAB    Lipid Panel w/calc LDL (03/02/2018 2:16 PM EDT)  Specimen  Blood   Lipid Panel w/calc LDL (03/02/2018 2:16 PM EDT)  Performing Organization Address City/State/Zipcode Phone Number  [facility] - LAB  [place], [place] [address]    Back to top of Lab Results    Hemoglobin A1C (03/02/2018 2:16 PM EDT) Hemoglobin A1C (03/02/2018 2:16 PM EDT)  Component Value Ref Range Performed At Pathologist Signature  Hemoglobin A1C 5.5 4.2 - 5.6 % [facility]   Average Blood Glucose (Calc) 111 mg/dL [facility] - [facility]    Hemoglobin A1C (03/02/2018 2:16 PM EDT)  Specimen  Blood   Hemoglobin A1C (03/02/2018 2:16 PM EDT)  Narrative Performed At  Normal Range:4.2 - 5.6%  Increased Risk:5.7 - 6.4%  Diabetes:>= 6.5%  Glycemic Control for adults with diabetes:<7%  [place] - LAB    Hemoglobin A1C (03/02/2018 2:16 PM EDT)  Performing Organization Address City/State/Zipcode Phone Number  [facility] - LAB  [place], [place] [address]    Back to top of Lab Results   Visit Diagnoses - documented in this  encounter  Diagnosis  Adenomatous polyp - Primary  Benign neoplasm of unspecified site   Hypertension, essential  Unspecified essential hypertension   Prediabetes  Other abnormal glucose   Mixed hyperlipidemia   Rectal bleeding  Hemorrhage of rectum and anus   Hemorrhoids, internal, with bleeding   Essential hypertension   CKD (chronic kidney disease) stage 3, GFR 30-59 ml/min (CMS-HCC)  Chronic kidney disease, Stage III (moderate)    Discontinued Medications - documented as of this encounter  Medication Sig Discontinue Reason Start Date End Date  amLODIPine (NORVASC) 10 MG tablet  Take 1 tablet (10 mg total) by mouth once daily. Reorder 06/20/2017 03/02/2018  carvedilol (COREG) 25 MG tablet  Take 1 tablet (25 mg total) by mouth 2 (two) times daily with meals Reorder 02/26/2018 03/02/2018  hydrALAZINE (APRESOLINE) 50 MG tablet  Take 1 tablet (50 mg total) by mouth 2 (two) times daily. Reorder 07/24/2017 03/02/2018  losartan (COZAAR) 100 MG tablet  Take 1 tablet (100 mg total) by mouth once daily Reorder 02/26/2018 03/02/2018   Images Patient Contacts   Contact Name Contact Address Communication Relationship to Patient  Joshua Soulier Unknown [phone] [facility]) Spouse, Emergency Contact   Document Information  Primary Care Provider Other Service Providers Document Coverage Dates  Sheron Nightingale PA (Nov. 05, 2018November 05, 2018 - Present) [phone] (Work) [phone] (Fax)  [place], [place] [address]   Mar. 25, 2019March 25, 2019 - Apr. 02, 2019April 02, 2019   [place] [address] [place], [place] [address]   Encounter Providers Encounter Date  Sheron Nightingale PA (Attending) [phone] (Work) [phone] (Fax) [place] [facility] [place], [place] [address]  Mar. 25, 2019March 25, 2019 - Apr. 02, 2019April 02, 2019

## 2018-05-07 ENCOUNTER — Ambulatory Visit: Payer: Managed Care, Other (non HMO) | Admitting: Anesthesiology

## 2018-05-07 ENCOUNTER — Encounter: Payer: Self-pay | Admitting: *Deleted

## 2018-05-07 ENCOUNTER — Ambulatory Visit
Admission: RE | Admit: 2018-05-07 | Discharge: 2018-05-07 | Disposition: A | Discharge status: Home / Self Care | Payer: Managed Care, Other (non HMO) | Source: Ambulatory Visit | Attending: General Surgery | Admitting: General Surgery

## 2018-05-07 ENCOUNTER — Encounter
Admission: RE | Disposition: A | Discharge status: Home / Self Care | Payer: Self-pay | Source: Ambulatory Visit | Attending: General Surgery

## 2018-05-07 DIAGNOSIS — R7303 Prediabetes: Secondary | ICD-10-CM | POA: Diagnosis not present

## 2018-05-07 DIAGNOSIS — Z79899 Other long term (current) drug therapy: Secondary | ICD-10-CM | POA: Insufficient documentation

## 2018-05-07 DIAGNOSIS — Z86010 Personal history of colonic polyps: Secondary | ICD-10-CM | POA: Diagnosis not present

## 2018-05-07 DIAGNOSIS — I10 Essential (primary) hypertension: Secondary | ICD-10-CM | POA: Diagnosis not present

## 2018-05-07 DIAGNOSIS — E785 Hyperlipidemia, unspecified: Secondary | ICD-10-CM | POA: Insufficient documentation

## 2018-05-07 DIAGNOSIS — K641 Second degree hemorrhoids: Secondary | ICD-10-CM | POA: Diagnosis not present

## 2018-05-07 DIAGNOSIS — K644 Residual hemorrhoidal skin tags: Secondary | ICD-10-CM | POA: Diagnosis not present

## 2018-05-07 DIAGNOSIS — Z8249 Family history of ischemic heart disease and other diseases of the circulatory system: Secondary | ICD-10-CM | POA: Diagnosis not present

## 2018-05-07 DIAGNOSIS — K219 Gastro-esophageal reflux disease without esophagitis: Secondary | ICD-10-CM | POA: Insufficient documentation

## 2018-05-07 DIAGNOSIS — F17210 Nicotine dependence, cigarettes, uncomplicated: Secondary | ICD-10-CM | POA: Insufficient documentation

## 2018-05-07 DIAGNOSIS — K648 Other hemorrhoids: Secondary | ICD-10-CM | POA: Diagnosis present

## 2018-05-07 HISTORY — DX: Fracture of corpus cavernosum penis, initial encounter: S39.840A

## 2018-05-07 HISTORY — PX: HEMORRHOID SURGERY: SHX153

## 2018-05-07 HISTORY — DX: Chronic kidney disease, unspecified: N18.9

## 2018-05-07 SURGERY — HEMORRHOIDECTOMY
Anesthesia: General | Wound class: Contaminated

## 2018-05-07 MED ORDER — HYDROCODONE-ACETAMINOPHEN 5-325 MG PO TABS: 1.0000 | ORAL_TABLET | ORAL | 0 refills | Status: AC | PRN

## 2018-05-07 MED ORDER — SODIUM CHLORIDE FLUSH 0.9 % IV SOLN
INTRAVENOUS | Status: AC
  Filled 2018-05-07: qty 10

## 2018-05-07 MED ORDER — BUPIVACAINE LIPOSOME 1.3 % IJ SUSP
INTRAMUSCULAR | Status: DC | PRN
  Administered 2018-05-07: 20 mL

## 2018-05-07 MED ORDER — ACETAMINOPHEN 10 MG/ML IV SOLN
INTRAVENOUS | Status: DC | PRN
  Administered 2018-05-07: 1000 mg via INTRAVENOUS

## 2018-05-07 MED ORDER — HYDROCODONE-ACETAMINOPHEN 5-325 MG PO TABS
ORAL_TABLET | ORAL | Status: AC
  Administered 2018-05-07: 1 via ORAL
  Filled 2018-05-07: qty 1

## 2018-05-07 MED ORDER — CIPROFLOXACIN IN D5W 400 MG/200ML IV SOLN
INTRAVENOUS | Status: AC
  Filled 2018-05-07: qty 200

## 2018-05-07 MED ORDER — GLYCOPYRROLATE 0.2 MG/ML IJ SOLN
INTRAMUSCULAR | Status: DC | PRN
  Administered 2018-05-07: 0.2 mg via INTRAVENOUS

## 2018-05-07 MED ORDER — BUPIVACAINE HCL (PF) 0.5 % IJ SOLN
INTRAMUSCULAR | Status: AC
  Filled 2018-05-07: qty 30

## 2018-05-07 MED ORDER — PROPOFOL 10 MG/ML IV BOLUS
INTRAVENOUS | Status: DC | PRN
  Administered 2018-05-07: 180 mg via INTRAVENOUS

## 2018-05-07 MED ORDER — BUPIVACAINE LIPOSOME 1.3 % IJ SUSP
INTRAMUSCULAR | Status: AC
  Filled 2018-05-07: qty 20

## 2018-05-07 MED ORDER — HYDROCODONE-ACETAMINOPHEN 5-325 MG PO TABS
1.0000 | ORAL_TABLET | ORAL | Status: DC | PRN
  Administered 2018-05-07: 1 via ORAL

## 2018-05-07 MED ORDER — FENTANYL CITRATE (PF) 100 MCG/2ML IJ SOLN
INTRAMUSCULAR | Status: AC
  Administered 2018-05-07: 25 ug via INTRAVENOUS
  Filled 2018-05-07: qty 2

## 2018-05-07 MED ORDER — BUPIVACAINE-EPINEPHRINE (PF) 0.5% -1:200000 IJ SOLN
INTRAMUSCULAR | Status: DC | PRN
  Administered 2018-05-07: 30 mL via PERINEURAL

## 2018-05-07 MED ORDER — ONDANSETRON HCL 4 MG/2ML IJ SOLN
INTRAMUSCULAR | Status: AC
  Filled 2018-05-07: qty 2

## 2018-05-07 MED ORDER — LIDOCAINE HCL (CARDIAC) PF 100 MG/5ML IV SOSY
PREFILLED_SYRINGE | INTRAVENOUS | Status: DC | PRN
  Administered 2018-05-07: 100 mg via INTRAVENOUS

## 2018-05-07 MED ORDER — FAMOTIDINE 20 MG PO TABS
20.0000 mg | ORAL_TABLET | Freq: Once | ORAL | Status: AC
  Administered 2018-05-07: 20 mg via ORAL

## 2018-05-07 MED ORDER — MIDAZOLAM HCL 2 MG/2ML IJ SOLN
INTRAMUSCULAR | Status: AC
  Filled 2018-05-07: qty 2

## 2018-05-07 MED ORDER — MIDAZOLAM HCL 2 MG/2ML IJ SOLN
INTRAMUSCULAR | Status: DC | PRN
  Administered 2018-05-07: 2 mg via INTRAVENOUS

## 2018-05-07 MED ORDER — EPHEDRINE SULFATE 50 MG/ML IJ SOLN
INTRAMUSCULAR | Status: AC
  Filled 2018-05-07: qty 1

## 2018-05-07 MED ORDER — FENTANYL CITRATE (PF) 100 MCG/2ML IJ SOLN
INTRAMUSCULAR | Status: AC
  Filled 2018-05-07: qty 2

## 2018-05-07 MED ORDER — LACTATED RINGERS IV SOLN
INTRAVENOUS | Status: DC
  Administered 2018-05-07: 07:00:00 via INTRAVENOUS

## 2018-05-07 MED ORDER — ONDANSETRON HCL 4 MG/2ML IJ SOLN: 4.0000 mg | Freq: Once | INTRAMUSCULAR | Status: DC | PRN

## 2018-05-07 MED ORDER — PHENYLEPHRINE HCL 10 MG/ML IJ SOLN
INTRAMUSCULAR | Status: AC
  Filled 2018-05-07: qty 1

## 2018-05-07 MED ORDER — BUPIVACAINE-EPINEPHRINE (PF) 0.5% -1:200000 IJ SOLN
INTRAMUSCULAR | Status: AC
  Filled 2018-05-07: qty 30

## 2018-05-07 MED ORDER — FAMOTIDINE 20 MG PO TABS
ORAL_TABLET | ORAL | Status: AC
  Administered 2018-05-07: 20 mg via ORAL
  Filled 2018-05-07: qty 1

## 2018-05-07 MED ORDER — EPHEDRINE SULFATE 50 MG/ML IJ SOLN
INTRAMUSCULAR | Status: DC | PRN
Start: 2018-05-07 — End: 2018-05-07
  Administered 2018-05-07 (×4): 5 mg via INTRAVENOUS

## 2018-05-07 MED ORDER — LIDOCAINE HCL (PF) 2 % IJ SOLN
INTRAMUSCULAR | Status: AC
  Filled 2018-05-07: qty 10

## 2018-05-07 MED ORDER — FENTANYL CITRATE (PF) 100 MCG/2ML IJ SOLN
INTRAMUSCULAR | Status: DC | PRN
  Administered 2018-05-07 (×2): 25 ug via INTRAVENOUS
  Administered 2018-05-07 (×2): 50 ug via INTRAVENOUS

## 2018-05-07 MED ORDER — ACETAMINOPHEN 10 MG/ML IV SOLN
INTRAVENOUS | Status: AC
Start: 2018-05-07 — End: ?
  Filled 2018-05-07: qty 100

## 2018-05-07 MED ORDER — FENTANYL CITRATE (PF) 100 MCG/2ML IJ SOLN
25.0000 ug | INTRAMUSCULAR | Status: DC | PRN
  Administered 2018-05-07 (×4): 25 ug via INTRAVENOUS

## 2018-05-07 MED ORDER — PROPOFOL 10 MG/ML IV BOLUS
INTRAVENOUS | Status: AC
  Filled 2018-05-07: qty 20

## 2018-05-07 SURGICAL SUPPLY — 35 items
BLADE SURG 15 STRL LF DISP TIS (BLADE) ×1 IMPLANT
BLADE SURG 15 STRL SS (BLADE) ×2
BRIEF STRETCH MATERNITY 2XLG (MISCELLANEOUS) ×3 IMPLANT
CANISTER SUCT 1200ML W/VALVE (MISCELLANEOUS) ×3 IMPLANT
DRAPE LAPAROTOMY 100X77 ABD (DRAPES) ×3 IMPLANT
DRAPE LEGGINS SURG 28X43 STRL (DRAPES) ×3 IMPLANT
DRAPE UNDER BUTTOCK W/FLU (DRAPES) ×3 IMPLANT
ELECT REM PT RETURN 9FT ADLT (ELECTROSURGICAL) ×3
ELECTRODE REM PT RTRN 9FT ADLT (ELECTROSURGICAL) ×1 IMPLANT
GAUZE SPONGE 4X4 12PLY STRL (GAUZE/BANDAGES/DRESSINGS) ×3 IMPLANT
GLOVE BIO SURGEON STRL SZ 6.5 (GLOVE) ×4 IMPLANT
GLOVE BIO SURGEONS STRL SZ 6.5 (GLOVE) ×2
GOWN STRL REUS W/ TWL LRG LVL3 (GOWN DISPOSABLE) ×2 IMPLANT
GOWN STRL REUS W/TWL LRG LVL3 (GOWN DISPOSABLE) ×4
HEMOSTAT SURGICEL 2X3 (HEMOSTASIS) ×3 IMPLANT
LABEL OR SOLS (LABEL) ×3 IMPLANT
NEEDLE HYPO 25X1 1.5 SAFETY (NEEDLE) ×3 IMPLANT
NS IRRIG 500ML POUR BTL (IV SOLUTION) ×3 IMPLANT
PACK BASIN MINOR ARMC (MISCELLANEOUS) ×3 IMPLANT
PAD ABD DERMACEA PRESS 5X9 (GAUZE/BANDAGES/DRESSINGS) ×3 IMPLANT
PAD PREP 24X41 OB/GYN DISP (PERSONAL CARE ITEMS) ×3 IMPLANT
SHEARS HARMONIC 9CM CVD (BLADE) ×3 IMPLANT
SOL PREP PVP 2OZ (MISCELLANEOUS) ×3
SOLUTION PREP PVP 2OZ (MISCELLANEOUS) ×1 IMPLANT
STAPLER PROXIMATE HCS (STAPLE) IMPLANT
STRAP SAFETY 5IN WIDE (MISCELLANEOUS) ×3 IMPLANT
SURGILUBE 2OZ TUBE FLIPTOP (MISCELLANEOUS) ×3 IMPLANT
SUT ETHILON 3-0 FS-10 30 BLK (SUTURE)
SUT VIC AB 2-0 SH 27 (SUTURE) ×4
SUT VIC AB 2-0 SH 27XBRD (SUTURE) ×2 IMPLANT
SUT VIC AB 3-0 SH 27 (SUTURE) ×2
SUT VIC AB 3-0 SH 27X BRD (SUTURE) ×1 IMPLANT
SUTURE EHLN 3-0 FS-10 30 BLK (SUTURE) IMPLANT
SYR 10ML LL (SYRINGE) ×3 IMPLANT
SYR BULB IRRIG 60ML STRL (SYRINGE) ×3 IMPLANT

## 2018-05-07 NOTE — Anesthesia Procedure Notes (Signed)
Procedure Name: LMA Insertion Performed by: Jeremias Broyhill, CRNA Pre-anesthesia Checklist: Patient identified, Patient being monitored, Timeout performed, Emergency Drugs available and Suction available Patient Re-evaluated:Patient Re-evaluated prior to induction Oxygen Delivery Method: Circle system utilized Preoxygenation: Pre-oxygenation with 100% oxygen Induction Type: IV induction LMA: LMA inserted LMA Size: 4.5 Tube type: Oral Number of attempts: 1 Placement Confirmation: positive ETCO2 and breath sounds checked- equal and bilateral Tube secured with: Tape Dental Injury: Teeth and Oropharynx as per pre-operative assessment        

## 2018-05-07 NOTE — Interval H&P Note (Signed)
History and Physical Interval Note:  05/07/2018 7:01 AM  Ricky Simmons  has presented today for surgery, with the diagnosis of internal and external bleeding hemorrhoids  The various methods of treatment have been discussed with the patient and family. After consideration of risks, benefits and other options for treatment, the patient has consented to  Procedure(s): HEMORRHOIDECTOMY (N/A) as a surgical intervention .  The patient's history has been reviewed, patient examined, no change in status, stable for surgery.  I have reviewed the patient's chart and labs.  Questions were answered to the patient's satisfaction.     Herbert Pun

## 2018-05-07 NOTE — Discharge Instructions (Signed)
°  Diet: Resume home heart healthy regular diet.   Activity: No heavy lifting >20 pounds (children, pets, laundry, garbage) or strenuous activity until follow-up, but light activity and walking are encouraged. Do not drive or drink alcohol if taking narcotic pain medications.  Wound care: Remove dressing before next bowel movement. Once dressing removed, may shower with soapy water and pat dry (do not rub incisions), but no baths or submerging incision underwater until follow-up. (no swimming)   Medications: Resume all home medications. For mild to moderate pain: acetaminophen (Tylenol) or ibuprofen (if no kidney disease). Combining Tylenol with alcohol can substantially increase your risk of causing liver disease. Narcotic pain medications, if prescribed, can be used for severe pain, though may cause nausea, constipation, and drowsiness. Do not combine Tylenol and Norco within a 6 hour period as Norco contains Tylenol. If you do not need the narcotic pain medication, you do not need to fill the prescription.  Call office [phone]) at any time if any questions, worsening pain, fevers/chills, bleeding, drainage from incision site, or other concerns.   AMBULATORY SURGERY  DISCHARGE INSTRUCTIONS   1) The drugs that you were given will stay in your system until tomorrow so for the next 24 hours you should not:  A) Drive an automobile B) Make any legal decisions C) Drink any alcoholic beverage   2) You may resume regular meals tomorrow.  Today it is better to start with liquids and gradually work up to solid foods.  You may eat anything you prefer, but it is better to start with liquids, then soup and crackers, and gradually work up to solid foods.   3) Please notify your doctor immediately if you have any unusual bleeding, trouble breathing, redness and pain at the surgery site, drainage, fever, or pain not relieved by medication.    4) Additional Instructions:        Please  contact your physician with any problems or Same Day Surgery at [phone], Monday through Friday 6 am to 4 pm, or [place] at [facility] number at [phone].

## 2018-05-07 NOTE — Anesthesia Preprocedure Evaluation (Signed)
Anesthesia Evaluation  Patient identified by MRN, date of birth, ID band Patient awake    Reviewed: Allergy & Precautions, NPO status , Patient's Chart, lab work & pertinent test results, reviewed documented beta blocker date and time   Airway Mallampati: II  TM Distance: >3 FB     Dental  (+) Chipped   Pulmonary Current Smoker,           Cardiovascular hypertension, Pt. on medications and Pt. on home beta blockers      Neuro/Psych    GI/Hepatic GERD  Controlled,  Endo/Other    Renal/GU Renal disease     Musculoskeletal   Abdominal   Peds  Hematology   Anesthesia Other Findings   Reproductive/Obstetrics                             Anesthesia Physical Anesthesia Plan  ASA: III  Anesthesia Plan: General   Post-op Pain Management:    Induction: Intravenous  PONV Risk Score and Plan:   Airway Management Planned: Oral ETT and LMA  Additional Equipment:   Intra-op Plan:   Post-operative Plan:   Informed Consent: I have reviewed the patients History and Physical, chart, labs and discussed the procedure including the risks, benefits and alternatives for the proposed anesthesia with the patient or authorized representative who has indicated his/her understanding and acceptance.     Plan Discussed with: CRNA  Anesthesia Plan Comments:         Anesthesia Quick Evaluation

## 2018-05-07 NOTE — Brief Op Note (Signed)
05/07/2018  8:59 AM  PATIENT:  Ricky Simmons  57 y.o. male  PRE-OPERATIVE DIAGNOSIS:  internal and external bleeding hemorrhoids  POST-OPERATIVE DIAGNOSIS:  internal and external bleeding hemorrhoids  PROCEDURE:  Procedure(s): HEMORRHOIDECTOMY (N/A)  SURGEON:  Surgeon(s) and Role:    * Herbert Pun, MD - Primary  ANESTHESIA:   local and general (LMA)  EBL:  5 mL

## 2018-05-07 NOTE — Anesthesia Postprocedure Evaluation (Signed)
Anesthesia Post Note  Patient: Ricky Simmons  Procedure(s) Performed: HEMORRHOIDECTOMY (N/A )  Patient location during evaluation: PACU Anesthesia Type: General Level of consciousness: awake and alert Pain management: pain level controlled Vital Signs Assessment: post-procedure vital signs reviewed and stable Respiratory status: spontaneous breathing, nonlabored ventilation, respiratory function stable and patient connected to nasal cannula oxygen Cardiovascular status: blood pressure returned to baseline and stable Postop Assessment: no apparent nausea or vomiting Anesthetic complications: no     Last Vitals:  Vitals:   05/07/18 0927 05/07/18 1009  BP: (!) 143/85 137/83  Pulse: 68 (!) 55  Resp: 16 16  Temp: (!) 36.1 C   SpO2: 100% 100%    Last Pain:  Vitals:   05/07/18 1009  TempSrc:   PainSc: 3                  Nyeshia Mysliwiec S

## 2018-05-07 NOTE — Anesthesia Post-op Follow-up Note (Signed)
Anesthesia QCDR form completed.        

## 2018-05-07 NOTE — Op Note (Signed)
Preoperative diagnosis: internal and external hemorrhoids with bleeding.   Postoperative diagnosis:  internal and external hemorrhoids with bleeding.  Procedure: Anoscopy, hemorrhoidectomy.  Surgeon: Dr. Windell Moment  Anesthesia: General (LMA)  Wound classification: Clean Contaminated  Indications: Patient is a 57 y.o. male was found to have symptomatic hemorrhoids refractory to medical managemen.   Findings: 1. Second degree hemorrhoids 2. Internal and external anal sphincter identified and preserved 3. Adequate hemostasis  Description of procedure: The patient was brought to the operating room and general (LMA) anesthesia was induced. Patient was placed in the lithotomy position. A time-out was completed verifying correct patient, procedure, site, positioning, and implant(s) and/or special equipment prior to beginning this procedure. The buttocks were taped apart.  The perineum was prepped and draped in standard sterile fashion. Local anesthetic was injected as a perianal block. An anoscope was introduced and the three hemorrhoidal pedicles were identified. A Kelly clamp was placed near the base of each pedicle near the dentate line and retracted externally to exteriorize the hemorrhoidal pedicles.  The two biggest pedicles (right posterior and left lateral) were excised in turn in the following fashion. The right anterior pedicle looks normal and was not excised. An elliptical incision was made extending from perianal skin to anorectal ring including both internal and external hemorrhoids and excising a minimum amount of anoderm. Flaps were developed on both aspects of the incision, taking care to elevate only skin and mucosa. The dilated venous mass was dissected using Harmonic device from the underlying sphincter muscle. The base was ligated with a 2-0 Vicryl figure of 8 suture. The pedicle was amputated from the base. Hemostasis was achieved using electrocautery. Following hemostasis, the  skin and mucosal incisions were closed with a running lock stitch of 2-0 Vicryl on the mucosal aspect and converted to subcuticular once skin was encountered. The anal canal was then injected with local anesthetic. A gauze pad was tucked between the gluteal folds.  The patient tolerated the procedure well and was taken to the postanesthesia care unit in stable condition.   Specimen: Right posterior hemorrhoid                     Left lateral hemorrhoid  Complications: none  EBL: 5 mL

## 2018-05-07 NOTE — Transfer of Care (Signed)
Immediate Anesthesia Transfer of Care Note  Patient: Ricky Simmons  Procedure(s) Performed: HEMORRHOIDECTOMY (N/A )  Patient Location: PACU  Anesthesia Type:General  Level of Consciousness: awake, alert  and responds to stimulation  Airway & Oxygen Therapy: Patient Spontanous Breathing and Patient connected to face mask oxygen  Post-op Assessment: Report given to RN and Post -op Vital signs reviewed and stable  Post vital signs: Reviewed and stable  Last Vitals:  Vitals Value Taken Time  BP 129/64 05/07/2018  8:49 AM  Temp 36.6 C 05/07/2018  8:49 AM  Pulse 72 05/07/2018  8:49 AM  Resp 18 05/07/2018  8:49 AM  SpO2 100 % 05/07/2018  8:49 AM  Vitals shown include unvalidated device data.  Last Pain:  Vitals:   05/07/18 0613  TempSrc: Tympanic  PainSc: 0-No pain         Complications: No apparent anesthesia complications

## 2018-05-08 LAB — SURGICAL PATHOLOGY

## 2018-08-05 ENCOUNTER — Telehealth: Payer: Self-pay | Admitting: *Deleted

## 2018-08-05 NOTE — Telephone Encounter (Signed)
Received referral for low dose lung cancer screening CT scan. Message left at phone number listed in EMR for patient to call me back to facilitate scheduling scan.  

## 2018-08-13 ENCOUNTER — Telehealth: Payer: Self-pay | Admitting: *Deleted

## 2018-08-13 NOTE — Telephone Encounter (Signed)
Received referral for low dose lung cancer screening CT scan.  Message left at phone number listed in EMR for patient to call either myself or Shawn Perkins back at [phone] to facilitate scheduling the scan.    

## 2018-08-18 ENCOUNTER — Encounter: Payer: Self-pay | Admitting: *Deleted

## 2018-08-18 ENCOUNTER — Telehealth: Payer: Self-pay | Admitting: *Deleted

## 2018-08-18 NOTE — Telephone Encounter (Signed)
Received referral for low dose lung cancer screening CT scan.  Message left at phone number listed in EMR for patient to call either myself or Shawn Perkins back at [phone] to facilitate scheduling the scan.    

## 2019-11-30 ENCOUNTER — Other Ambulatory Visit: Payer: Self-pay | Admitting: Nephrology

## 2019-11-30 DIAGNOSIS — R0989 Other specified symptoms and signs involving the circulatory and respiratory systems: Secondary | ICD-10-CM

## 2019-12-30 ENCOUNTER — Other Ambulatory Visit: Payer: Self-pay | Admitting: Nephrology

## 2019-12-30 DIAGNOSIS — R808 Other proteinuria: Secondary | ICD-10-CM

## 2020-01-03 ENCOUNTER — Ambulatory Visit: Admission: RE | Admit: 2020-01-03 | Payer: Managed Care, Other (non HMO) | Source: Ambulatory Visit

## 2020-02-03 ENCOUNTER — Other Ambulatory Visit: Payer: Self-pay

## 2020-02-03 ENCOUNTER — Ambulatory Visit
Admission: RE | Admit: 2020-02-03 | Discharge: 2020-02-03 | Disposition: A | Discharge status: Home / Self Care | Payer: Managed Care, Other (non HMO) | Source: Ambulatory Visit | Attending: Nephrology | Admitting: Nephrology

## 2020-02-03 DIAGNOSIS — R808 Other proteinuria: Secondary | ICD-10-CM

## 2020-02-03 DIAGNOSIS — R0989 Other specified symptoms and signs involving the circulatory and respiratory systems: Secondary | ICD-10-CM | POA: Insufficient documentation

## 2021-12-27 ENCOUNTER — Inpatient Hospital Stay
Admission: EM | Admit: 2021-12-27 | Discharge: 2022-01-02 | DRG: 660 | Disposition: A | Discharge status: Home / Self Care | Payer: Managed Care, Other (non HMO) | Attending: Internal Medicine | Admitting: Internal Medicine

## 2021-12-27 ENCOUNTER — Encounter: Payer: Self-pay | Admitting: Emergency Medicine

## 2021-12-27 ENCOUNTER — Emergency Department: Payer: Managed Care, Other (non HMO)

## 2021-12-27 ENCOUNTER — Other Ambulatory Visit: Payer: Self-pay

## 2021-12-27 DIAGNOSIS — Z9114 Patient's other noncompliance with medication regimen: Secondary | ICD-10-CM | POA: Diagnosis not present

## 2021-12-27 DIAGNOSIS — Z8249 Family history of ischemic heart disease and other diseases of the circulatory system: Secondary | ICD-10-CM

## 2021-12-27 DIAGNOSIS — E785 Hyperlipidemia, unspecified: Secondary | ICD-10-CM | POA: Diagnosis present

## 2021-12-27 DIAGNOSIS — N179 Acute kidney failure, unspecified: Secondary | ICD-10-CM

## 2021-12-27 DIAGNOSIS — T465X6A Underdosing of other antihypertensive drugs, initial encounter: Secondary | ICD-10-CM | POA: Diagnosis present

## 2021-12-27 DIAGNOSIS — N201 Calculus of ureter: Secondary | ICD-10-CM | POA: Diagnosis not present

## 2021-12-27 DIAGNOSIS — F17210 Nicotine dependence, cigarettes, uncomplicated: Secondary | ICD-10-CM | POA: Diagnosis present

## 2021-12-27 DIAGNOSIS — N189 Chronic kidney disease, unspecified: Secondary | ICD-10-CM

## 2021-12-27 DIAGNOSIS — R809 Proteinuria, unspecified: Secondary | ICD-10-CM | POA: Diagnosis present

## 2021-12-27 DIAGNOSIS — K219 Gastro-esophageal reflux disease without esophagitis: Secondary | ICD-10-CM | POA: Diagnosis present

## 2021-12-27 DIAGNOSIS — N132 Hydronephrosis with renal and ureteral calculous obstruction: Secondary | ICD-10-CM | POA: Diagnosis present

## 2021-12-27 DIAGNOSIS — N139 Obstructive and reflux uropathy, unspecified: Secondary | ICD-10-CM | POA: Diagnosis not present

## 2021-12-27 DIAGNOSIS — N2889 Other specified disorders of kidney and ureter: Secondary | ICD-10-CM | POA: Diagnosis present

## 2021-12-27 DIAGNOSIS — I3139 Other pericardial effusion (noninflammatory): Secondary | ICD-10-CM | POA: Diagnosis present

## 2021-12-27 DIAGNOSIS — N3289 Other specified disorders of bladder: Secondary | ICD-10-CM | POA: Diagnosis present

## 2021-12-27 DIAGNOSIS — R000 Tachycardia, unspecified: Secondary | ICD-10-CM | POA: Diagnosis present

## 2021-12-27 DIAGNOSIS — Z79899 Other long term (current) drug therapy: Secondary | ICD-10-CM

## 2021-12-27 DIAGNOSIS — I161 Hypertensive emergency: Secondary | ICD-10-CM | POA: Diagnosis present

## 2021-12-27 DIAGNOSIS — F17200 Nicotine dependence, unspecified, uncomplicated: Secondary | ICD-10-CM | POA: Diagnosis present

## 2021-12-27 DIAGNOSIS — I160 Hypertensive urgency: Secondary | ICD-10-CM | POA: Diagnosis present

## 2021-12-27 DIAGNOSIS — N200 Calculus of kidney: Secondary | ICD-10-CM

## 2021-12-27 DIAGNOSIS — N50811 Right testicular pain: Secondary | ICD-10-CM | POA: Diagnosis present

## 2021-12-27 DIAGNOSIS — E875 Hyperkalemia: Secondary | ICD-10-CM | POA: Diagnosis present

## 2021-12-27 DIAGNOSIS — I130 Hypertensive heart and chronic kidney disease with heart failure and stage 1 through stage 4 chronic kidney disease, or unspecified chronic kidney disease: Secondary | ICD-10-CM | POA: Diagnosis present

## 2021-12-27 DIAGNOSIS — I5023 Acute on chronic systolic (congestive) heart failure: Secondary | ICD-10-CM

## 2021-12-27 DIAGNOSIS — I5022 Chronic systolic (congestive) heart failure: Secondary | ICD-10-CM

## 2021-12-27 DIAGNOSIS — I10 Essential (primary) hypertension: Secondary | ICD-10-CM

## 2021-12-27 DIAGNOSIS — N23 Unspecified renal colic: Secondary | ICD-10-CM | POA: Diagnosis present

## 2021-12-27 DIAGNOSIS — N1832 Chronic kidney disease, stage 3b: Secondary | ICD-10-CM | POA: Diagnosis present

## 2021-12-27 DIAGNOSIS — I5042 Chronic combined systolic (congestive) and diastolic (congestive) heart failure: Secondary | ICD-10-CM | POA: Diagnosis present

## 2021-12-27 DIAGNOSIS — Z20822 Contact with and (suspected) exposure to covid-19: Secondary | ICD-10-CM | POA: Diagnosis present

## 2021-12-27 LAB — URINALYSIS, ROUTINE W REFLEX MICROSCOPIC
Bacteria, UA: NONE SEEN
Bilirubin Urine: NEGATIVE
Glucose, UA: 50 mg/dL — AB
Ketones, ur: 5 mg/dL — AB
Leukocytes,Ua: NEGATIVE
Nitrite: NEGATIVE
Protein, ur: >=300 mg/dL — AB
Specific Gravity, Urine: 1.017 (ref 1.005–1.030)
Squamous Epithelial / HPF: NONE SEEN (ref 0–5)
pH: 5 (ref 5.0–8.0)

## 2021-12-27 LAB — CBC
HCT: 35.2 % — ABNORMAL LOW (ref 39.0–52.0)
Hemoglobin: 11.5 g/dL — ABNORMAL LOW (ref 13.0–17.0)
MCH: 30.5 pg (ref 26.0–34.0)
MCHC: 32.7 g/dL (ref 30.0–36.0)
MCV: 93.4 fL (ref 80.0–100.0)
Platelets: 216 10*3/uL (ref 150–400)
RBC: 3.77 MIL/uL — ABNORMAL LOW (ref 4.22–5.81)
RDW: 14.1 % (ref 11.5–15.5)
WBC: 12.6 10*3/uL — ABNORMAL HIGH (ref 4.0–10.5)
nRBC: 0 % (ref 0.0–0.2)

## 2021-12-27 LAB — BASIC METABOLIC PANEL
Anion gap: 6 (ref 5–15)
BUN: 49 mg/dL — ABNORMAL HIGH (ref 6–20)
CO2: 19 mmol/L — ABNORMAL LOW (ref 22–32)
Calcium: 8.6 mg/dL — ABNORMAL LOW (ref 8.9–10.3)
Chloride: 109 mmol/L (ref 98–111)
Creatinine, Ser: 4.64 mg/dL — ABNORMAL HIGH (ref 0.61–1.24)
GFR, Estimated: 14 mL/min — ABNORMAL LOW (ref >60)
Glucose, Bld: 119 mg/dL — ABNORMAL HIGH (ref 70–99)
Potassium: 4.7 mmol/L (ref 3.5–5.1)
Sodium: 134 mmol/L — ABNORMAL LOW (ref 135–145)

## 2021-12-27 LAB — HEPATIC FUNCTION PANEL
ALT: 19 U/L (ref 0–44)
AST: 19 U/L (ref 15–41)
Albumin: 3.1 g/dL — ABNORMAL LOW (ref 3.5–5.0)
Alkaline Phosphatase: 52 U/L (ref 38–126)
Bilirubin, Direct: <0.1 mg/dL (ref 0.0–0.2)
Total Bilirubin: 0.6 mg/dL (ref 0.3–1.2)
Total Protein: 6.6 g/dL (ref 6.5–8.1)

## 2021-12-27 LAB — TROPONIN I (HIGH SENSITIVITY)
Troponin I (High Sensitivity): 58 ng/L — ABNORMAL HIGH (ref <18)
Troponin I (High Sensitivity): 53 ng/L — ABNORMAL HIGH (ref <18)

## 2021-12-27 LAB — RESP PANEL BY RT-PCR (FLU A&B, COVID) ARPGX2
Influenza A by PCR: NEGATIVE
Influenza B by PCR: NEGATIVE
SARS Coronavirus 2 by RT PCR: NEGATIVE

## 2021-12-27 LAB — LIPASE, BLOOD: Lipase: 43 U/L (ref 11–51)

## 2021-12-27 LAB — HIV ANTIBODY (ROUTINE TESTING W REFLEX): HIV Screen 4th Generation wRfx: NONREACTIVE

## 2021-12-27 LAB — BRAIN NATRIURETIC PEPTIDE: B Natriuretic Peptide: 2543 pg/mL — ABNORMAL HIGH (ref 0.0–100.0)

## 2021-12-27 MED ORDER — HYDROMORPHONE HCL 1 MG/ML IJ SOLN
0.5000 mg | Freq: Once | INTRAMUSCULAR | Status: AC
Start: 2021-12-27 — End: 2021-12-27
  Administered 2021-12-27: 0.5 mg via INTRAVENOUS
  Filled 2021-12-27: qty 1

## 2021-12-27 MED ORDER — NICOTINE 14 MG/24HR TD PT24
14.0000 mg | MEDICATED_PATCH | Freq: Every day | TRANSDERMAL | Status: DC
  Filled 2021-12-27 (×4): qty 1

## 2021-12-27 MED ORDER — HYDRALAZINE HCL 20 MG/ML IJ SOLN
10.0000 mg | Freq: Four times a day (QID) | INTRAMUSCULAR | Status: DC | PRN
  Administered 2021-12-27: 10 mg via INTRAVENOUS
  Filled 2021-12-27: qty 1

## 2021-12-27 MED ORDER — HYDRALAZINE HCL 50 MG PO TABS
50.0000 mg | ORAL_TABLET | Freq: Two times a day (BID) | ORAL | Status: DC
  Administered 2021-12-27 – 2021-12-28 (×3): 50 mg via ORAL
  Filled 2021-12-27 (×3): qty 1

## 2021-12-27 MED ORDER — SODIUM CHLORIDE 0.45 % IV SOLN: INTRAVENOUS | Status: DC

## 2021-12-27 MED ORDER — AMLODIPINE BESYLATE 10 MG PO TABS
10.0000 mg | ORAL_TABLET | ORAL | Status: DC
  Administered 2021-12-27 – 2022-01-02 (×7): 10 mg via ORAL
  Filled 2021-12-27: qty 2
  Filled 2021-12-27 (×6): qty 1

## 2021-12-27 MED ORDER — ONDANSETRON HCL 4 MG/2ML IJ SOLN
4.0000 mg | Freq: Four times a day (QID) | INTRAMUSCULAR | Status: DC | PRN
  Administered 2022-01-02: 05:00:00 4 mg via INTRAVENOUS
  Filled 2021-12-27: qty 2

## 2021-12-27 MED ORDER — ACETAMINOPHEN 500 MG PO TABS: 1000.0000 mg | ORAL_TABLET | Freq: Four times a day (QID) | ORAL | Status: DC | PRN

## 2021-12-27 MED ORDER — ONDANSETRON HCL 4 MG PO TABS: 4.0000 mg | ORAL_TABLET | Freq: Four times a day (QID) | ORAL | Status: DC | PRN

## 2021-12-27 MED ORDER — HYDROMORPHONE HCL 1 MG/ML IJ SOLN
1.0000 mg | INTRAMUSCULAR | Status: DC | PRN
  Administered 2021-12-27 – 2022-01-02 (×19): 1 mg via INTRAVENOUS
  Filled 2021-12-27 (×20): qty 1

## 2021-12-27 MED ORDER — SODIUM CHLORIDE 0.9 % IV BOLUS: 1000.0000 mL | Freq: Once | INTRAVENOUS | Status: DC

## 2021-12-27 MED ORDER — TAMSULOSIN HCL 0.4 MG PO CAPS
0.4000 mg | ORAL_CAPSULE | Freq: Every day | ORAL | Status: DC
  Administered 2021-12-27 – 2022-01-01 (×6): 0.4 mg via ORAL
  Filled 2021-12-27 (×6): qty 1

## 2021-12-27 MED ORDER — CARVEDILOL 25 MG PO TABS
25.0000 mg | ORAL_TABLET | Freq: Two times a day (BID) | ORAL | Status: DC
  Administered 2021-12-27 – 2022-01-02 (×13): 25 mg via ORAL
  Filled 2021-12-27 (×14): qty 1

## 2021-12-27 MED ORDER — PNEUMOCOCCAL VAC POLYVALENT 25 MCG/0.5ML IJ INJ: 0.5000 mL | INJECTION | INTRAMUSCULAR | Status: DC

## 2021-12-27 MED ORDER — HYDRALAZINE HCL 20 MG/ML IJ SOLN
10.0000 mg | Freq: Four times a day (QID) | INTRAMUSCULAR | Status: DC | PRN
  Administered 2021-12-28: 10 mg via INTRAVENOUS
  Filled 2021-12-27: qty 1

## 2021-12-27 MED ORDER — SODIUM CHLORIDE 0.45 % IV SOLN: INTRAVENOUS | Status: AC

## 2021-12-27 MED ORDER — INFLUENZA VAC SPLIT QUAD 0.5 ML IM SUSY
0.5000 mL | PREFILLED_SYRINGE | INTRAMUSCULAR | Status: DC
  Filled 2021-12-27: qty 0.5

## 2021-12-27 MED ORDER — LABETALOL HCL 5 MG/ML IV SOLN
10.0000 mg | Freq: Once | INTRAVENOUS | Status: AC
  Administered 2021-12-27: 10 mg via INTRAVENOUS
  Filled 2021-12-27: qty 4

## 2021-12-27 MED ORDER — ACETAMINOPHEN 325 MG PO TABS
650.0000 mg | ORAL_TABLET | Freq: Four times a day (QID) | ORAL | Status: DC | PRN
  Administered 2021-12-31: 650 mg via ORAL
  Filled 2021-12-27 (×2): qty 2

## 2021-12-27 MED ORDER — ACETAMINOPHEN 650 MG RE SUPP: 650.0000 mg | Freq: Four times a day (QID) | RECTAL | Status: DC | PRN

## 2021-12-27 NOTE — ED Triage Notes (Addendum)
Pt arrived via POV with reports of R flank pain radiating down to groin.  Pt also c/o shortness of breath when laying down as well.  Pt reports poor sleep x 4 nights.   Pt reports hematuria as well.   Pt states he has not had BP meds in over 3 weeks. Takes 4 different meds.

## 2021-12-27 NOTE — ED Provider Notes (Signed)
[facility] Provider Note    Event Date/Time   First MD Initiated Contact with Patient 12/27/21 [phone]     (approximate)   History   Shortness of Breath and Flank Pain   HPI  Ricky Simmons is a 61 y.o. male with history of hypertension who comes in with right flank pain rating down to the groin.  Patient reports having a history of kidney stone but this was many years ago.  He does report some pain into his groin and a little bit of blood-streaked urine.  Patient reports that he has not been taking his blood pressure meds in over 3 weeks.  He normally takes 4 different medications.  He also reports a little bit of shortness of breath that has been going on for the past few weeks.  He is reports mostly shortness of breath with laying flat.  No chest pain.  No leg swelling.  Did recently travel from [place] and stopped to get gas.     Physical Exam   Triage Vital Signs: ED Triage Vitals  Enc Vitals Group     BP 12/27/21 0350 (!) 190/138     Pulse Rate 12/27/21 0350 (!) 103     Resp 12/27/21 0350 18     Temp 12/27/21 0350 98.4 F (36.9 C)     Temp Source 12/27/21 0350 Oral     SpO2 12/27/21 0350 96 %     Weight 12/27/21 0351 160 lb (72.6 kg)     Height 12/27/21 0351 6' 2.5" (1.892 m)     Head Circumference --      Peak Flow --      Pain Score 12/27/21 0349 9     Pain Loc --      Pain Edu? --      Excl. in [place]? --     Most recent vital signs: Vitals:   12/27/21 0350  BP: (!) 190/138  Pulse: (!) 103  Resp: 18  Temp: 98.4 F (36.9 C)  SpO2: 96%     General: Awake, no distress.  Pleasant male CV:  Good peripheral perfusion.  Resp:  Normal effort.  Clear lungs Abd:  No distention.  Some mild right-sided tenderness  other:  No swelling of the legs, no calf tenderness   ED Results / Procedures / Treatments   Labs (all labs ordered are listed, but only abnormal results are displayed) Labs Reviewed  BASIC METABOLIC PANEL - Abnormal;  Notable for the following components:      Result Value   Sodium 134 (*)    CO2 19 (*)    Glucose, Bld 119 (*)    BUN 49 (*)    Creatinine, Ser 4.64 (*)    Calcium 8.6 (*)    GFR, Estimated 14 (*)    All other components within normal limits  CBC - Abnormal; Notable for the following components:   WBC 12.6 (*)    RBC 3.77 (*)    Hemoglobin 11.5 (*)    HCT 35.2 (*)    All other components within normal limits  TROPONIN I (HIGH SENSITIVITY) - Abnormal; Notable for the following components:   Troponin I (High Sensitivity) 58 (*)    All other components within normal limits  TROPONIN I (HIGH SENSITIVITY)     EKG  My interpretation of EKG:  Normal sinus rate of 96 without any ST elevations, T wave versions in V5 and V6, normal intervals  RADIOLOGY I have reviewed the  xray personally and agree with radiology read no evidence of pulmonary edema.  Does have cardiomegaly     PROCEDURES:  Critical Care performed: Yes, see critical care procedure note(s)  .1-3 Lead EKG Interpretation Performed by: Vanessa Mabton, MD Authorized by: Vanessa Strandquist, MD     Interpretation: normal     ECG rate:  90s   ECG rate assessment: normal     Rhythm: sinus rhythm     Ectopy: none     Conduction: normal   .Critical Care Performed by: Vanessa DeQuincy, MD Authorized by: Vanessa [place], MD   Critical care provider statement:    Critical care time (minutes):  30   Critical care was necessary to treat or prevent imminent or life-threatening deterioration of the following conditions: HTN emergency.   Critical care was time spent personally by me on the following activities:  Development of treatment plan with patient or surrogate, discussions with consultants, evaluation of patient's response to treatment, examination of patient, ordering and review of laboratory studies, ordering and review of radiographic studies, ordering and performing treatments and interventions, pulse oximetry, re-evaluation  of patient's condition and review of old charts   [place] ED: Medications  labetalol (NORMODYNE) injection 10 mg (10 mg Intravenous Given 12/27/21 0606)  HYDROmorphone (DILAUDID) injection 0.5 mg (0.5 mg Intravenous Given 12/27/21 0603)     IMPRESSION / MDM / [place] / ED COURSE  I reviewed the triage vital signs and the nursing notes.   Patient with hypertension comes in with right kidney pain and some shortness of breath Differential diagnosis includes, but is not limited to, kidney stone, gallbladder pathology, low suspicion for appendicitis, SBO.  For the shortness of breath I suspect is from his elevated blood pressure may be a little bit of edema.  Lower suspicion for PE given no significant risk factors.  Will get COVID, flu test  BMP shows significantly elevated creatinine.  I reviewed his notes from March 2021 his creatinine was 1.6 and today it is 4.6. His urine shows significant protein in it but no evidence of infection or blood. His hemoglobin on his CBC is slightly low but not grossly abnormal.  White count slightly elevated Initial troponin slightly elevated but this could be in the setting of his aki His BNP is elevated which could be the cause of his shortness of breath given some possible pulmonary edema   Will give patient dose of IV labetalol due to my concern for hypertensive emergency causing endorgan damage such as elevated creatinine and IV Dilaudid to help with pain.  CT imaging concerning for kidney stone on the right.  I did discuss with urology Dr. Erlene Quan who will come evaluate patient.  We will keep patient n.p.o. in case they want to place a stent.  Were unable to give a load of IV fluids due to my concern for some CHF as well.  I did discuss with the hospitalist team for admission  The patient is on the cardiac monitor to evaluate for eidence of arrhythmia and/or significant heart rate changes.  FINAL CLINICAL IMPRESSION(S) / ED  DIAGNOSES   Final diagnoses:  Hypertensive emergency  AKI (acute kidney injury) ([place])  Kidney stone     Rx / DC Orders   ED Discharge Orders     None        Note:  This document was prepared using Dragon voice recognition software and may include unintentional dictation errors.   Jari Pigg,  Royetta Crochet, MD 12/27/21 [phone]

## 2021-12-27 NOTE — H&P (Addendum)
History and Physical    Ricky Simmons TOI:[id number] DOB: 01/13/1961 DOA: 12/27/2021  PCP: Marinda Elk, MD   Patient coming from: Home  I have personally briefly reviewed patient's old medical records in [place]  Chief Complaint: Right flank pain  HPI: Ricky Simmons is a 61 y.o. male with medical history significant for nephrolithiasis, hypertension, chronic kidney disease stage III, nicotine dependence, medication noncompliance who presents to the ER for evaluation of 3-day history of right flank pain that has progressively worsened. Pain intensity at its worst with radiation down to the groin associated with dysuria, frequency and hematuria.  Pain feels similar to his prior episodes of kidney stones. He also complains of shortness of breath mostly with exertion and has a cough productive of greenish phlegm.  He is unable to lay flat. He denies having any fever, no chills, no palpitations, no diaphoresis, no nausea, no vomiting, no headache, no dizziness or lightheadedness.  He has no changes in his bowel habits. Sodium 134, potassium 4.7, chloride 109, bicarb 19, BUN 49, creatinine 4.64 compared to baseline of 1.60 from 2021, calcium 8.6, alkaline phosphatase 52, albumin 3.1, lipase 43, AST 19, ALT 19, total protein 6.6, BNP 2543, troponin 58, white count 12.6, hemoglobin 11.5, hematocrit 35.2, MCV 93.4, RDW 14.1, platelet count 216 Respiratory viral panel is pending Urinalysis is sterile Renal stone CT shows right-sided hydronephrosis and hydroureter secondary to a 2 to 3 mm right UVJ calculus.  Nonobstructing left nephrolithiasis.  Indeterminate 1.7 cm exophytic lesion arising off the lateral cortex of the left kidney.  Cardiomegaly with small pericardial effusion. Chest x-ray reviewed by me shows cardiomegaly with no pleural effusion. EKG reviewed by me shows normal sinus rhythm with left axis deviation and LVH.  ST changes in the lateral leads.     ED Course:  Patient is a 61 year old male who presents to the ER for evaluation of right flank pain and is noted to have worsening of his renal function from baseline. At baseline his serum creatinine is between 1.6 -1.85 and today on admission it is 4.64. Blood pressure is also significantly elevated and patient admits to being noncompliant with his prescribed medications. Renal stone CT shows right-sided hydronephrosis with an obstructing right UVJ calculus. He will be admitted for further evaluation.     Review of Systems: As per HPI otherwise all other systems reviewed and negative.    Past Medical History:  Diagnosis Date   Chronic kidney disease    STAGE 3-FOLLOWED BY PCP   GERD (gastroesophageal reflux disease)    RARE   History of kidney stones    H/O   Hypertension    Penile fracture 1989    Past Surgical History:  Procedure Laterality Date   COLONOSCOPY     HEMORRHOID SURGERY N/A 05/07/2018   Procedure: HEMORRHOIDECTOMY;  Surgeon: Herbert Pun, MD;  Location: [facility] ORS;  Service: General;  Laterality: N/A;   TONSILLECTOMY     AGE 80     reports that he has been smoking cigarettes. He has never used smokeless tobacco. He reports that he does not drink alcohol and does not use drugs.  No Known Allergies  Family History  Problem Relation Age of Onset   Hypertension Mother       Prior to Admission medications   Medication Sig Start Date End Date Taking? Authorizing Provider  acetaminophen (TYLENOL) 500 MG tablet Take 1,000 mg by mouth every 6 (six) hours as needed (for pain/headaches.).  Yes [provider]  amLODipine (NORVASC) 10 MG tablet Take 10 mg by mouth every morning.  03/02/18  Yes [provider]  carvedilol (COREG) 25 MG tablet Take 25 mg by mouth 2 (two) times daily. 03/02/18  Yes [provider]  hydrALAZINE (APRESOLINE) 50 MG tablet Take 50 mg by mouth 2 (two) times daily. 03/03/18  Yes [provider]  losartan  (COZAAR) 100 MG tablet Take 100 mg by mouth every morning.  03/02/18  Yes [provider]    Physical Exam: Vitals:   12/27/21 0710 12/27/21 0717 12/27/21 0800 12/27/21 0830  BP: (!) 188/128  (!) 179/115 (!) 190/135  Pulse: 87  85 92  Resp: (!) 26  (!) 21 (!) 27  Temp:  98.1 F (36.7 C)    TempSrc:  Oral    SpO2: 97%  98% 97%  Weight:      Height:         Vitals:   12/27/21 0710 12/27/21 0717 12/27/21 0800 12/27/21 0830  BP: (!) 188/128  (!) 179/115 (!) 190/135  Pulse: 87  85 92  Resp: (!) 26  (!) 21 (!) 27  Temp:  98.1 F (36.7 C)    TempSrc:  Oral    SpO2: 97%  98% 97%  Weight:      Height:          Constitutional: Alert and oriented x 3 . Not in any apparent distress HEENT:      Head: Normocephalic and atraumatic.         Eyes: PERLA, EOMI, Conjunctivae are normal. Sclera is non-icteric.       Mouth/Throat: Mucous membranes are moist.       Neck: Supple with no signs of meningismus. Cardiovascular: Regular rate and rhythm. No murmurs, gallops, or rubs. 2+ symmetrical distal pulses are present . No JVD. No LE edema Respiratory: Respiratory effort normal .Lungs sounds clear bilaterally. No wheezes, crackles, or rhonchi.  Gastrointestinal: Soft, right CVA tenderness, and non distended with positive bowel sounds.  Genitourinary: Right CVA tenderness. Musculoskeletal: Nontender with normal range of motion in all extremities. No cyanosis, or erythema of extremities. Neurologic:  Face is symmetric. Moving all extremities. No gross focal neurologic deficits . Skin: Skin is warm, dry.  No rash or ulcers Psychiatric: Mood and affect are normal    Labs on Admission: I have personally reviewed following labs and imaging studies  CBC: Recent Labs  Lab 12/27/21 0404  WBC 12.6*  HGB 11.5*  HCT 35.2*  MCV 93.4  PLT 270   Basic Metabolic Panel: Recent Labs  Lab 12/27/21 0404  NA 134*  K 4.7  CL 109  CO2 19*  GLUCOSE 119*  BUN 49*  CREATININE 4.64*   CALCIUM 8.6*   GFR: Estimated Creatinine Clearance: 17.8 mL/min (A) (by C-G formula based on SCr of 4.64 mg/dL (H)). Liver Function Tests: Recent Labs  Lab 12/27/21 0404  AST 19  ALT 19  ALKPHOS 52  BILITOT 0.6  PROT 6.6  ALBUMIN 3.1*   Recent Labs  Lab 12/27/21 0404  LIPASE 43   No results for input(s): AMMONIA in the last 168 hours. Coagulation Profile: No results for input(s): INR, PROTIME in the last 168 hours. Cardiac Enzymes: No results for input(s): CKTOTAL, CKMB, CKMBINDEX, TROPONINI in the last 168 hours. BNP (last 3 results) No results for input(s): PROBNP in the last 8760 hours. HbA1C: No results for input(s): HGBA1C in the last 72 hours. CBG: No results for input(s):  GLUCAP in the last 168 hours. Lipid Profile: No results for input(s): CHOL, HDL, LDLCALC, TRIG, CHOLHDL, LDLDIRECT in the last 72 hours. Thyroid Function Tests: No results for input(s): TSH, T4TOTAL, FREET4, T3FREE, THYROIDAB in the last 72 hours. Anemia Panel: No results for input(s): VITAMINB12, FOLATE, FERRITIN, TIBC, IRON, RETICCTPCT in the last 72 hours. Urine analysis:    Component Value Date/Time   COLORURINE YELLOW (A) 12/27/2021 0550   APPEARANCEUR CLEAR (A) 12/27/2021 0550   LABSPEC 1.017 12/27/2021 0550   PHURINE 5.0 12/27/2021 0550   GLUCOSEU 50 (A) 12/27/2021 0550   HGBUR SMALL (A) 12/27/2021 0550   BILIRUBINUR NEGATIVE 12/27/2021 0550   KETONESUR 5 (A) 12/27/2021 0550   PROTEINUR >=300 (A) 12/27/2021 0550   NITRITE NEGATIVE 12/27/2021 0550   LEUKOCYTESUR NEGATIVE 12/27/2021 0550    Radiological Exams on Admission: DG Chest 2 View  Result Date: 12/27/2021 CLINICAL DATA:  61 year old male with shortness of breath. Right flank pain and hematuria. EXAM: CHEST - 2 VIEW COMPARISON:  None. FINDINGS: Cardiomegaly. Mildly tortuous thoracic aorta. Other mediastinal contours are within normal limits. Visualized tracheal air column is within normal limits. Normal lung volumes. No  pneumothorax, pulmonary edema, pleural effusion or confluent pulmonary opacity. No acute osseous abnormality identified. Negative visible bowel gas. IMPRESSION: Cardiomegaly.  No acute cardiopulmonary abnormality. Electronically Signed   By: Genevie Ann M.D.   On: 12/27/2021 04:33   CT Renal Stone Study  Result Date: 12/27/2021 CLINICAL DATA:  Flank pain.  Evaluate for kidney stone EXAM: CT ABDOMEN AND PELVIS WITHOUT CONTRAST TECHNIQUE: Multidetector CT imaging of the abdomen and pelvis was performed following the standard protocol without IV contrast. RADIATION DOSE REDUCTION: This exam was performed according to the departmental dose-optimization program which includes automated exposure control, adjustment of the mA and/or kV according to patient size and/or use of iterative reconstruction technique. COMPARISON:  None. FINDINGS: Lower chest: Heart size is enlarged. No pleural effusion or edema. Small pericardial effusion noted. Hepatobiliary: No focal liver abnormality is seen. No gallstones, gallbladder wall thickening, or biliary dilatation. Pancreas: Unremarkable. No pancreatic ductal dilatation or surrounding inflammatory changes. Spleen: Normal in size without focal abnormality. Adrenals/Urinary Tract: Normal adrenal glands. There is a tiny stone within the right UVJ measuring 2-3 mm, image 80/2. This results in mild right hydronephrosis and hydroureter with perinephric fat stranding. Several punctate stones are identified within the upper pole the left kidney. No left-sided hydronephrosis. -Within the upper pole of the right kidney there is a 2.5 cm, 9.55 Hounsfield units, image 36/2. This is compatible with a benign cyst. -Arising off the lateral cortex of the left kidney is a 1.7 cm exophytic lesion measuring 29 Hounsfield units, image 39/2. This does not meet criteria for a simple cyst. Stomach/Bowel: Stomach is within normal limits. Appendix appears normal. No evidence of bowel wall thickening,  distention, or inflammatory changes. Sigmoid diverticulosis without signs of acute diverticulitis. Vascular/Lymphatic: Aortic atherosclerosis. No signs of abdominopelvic adenopathy. Reproductive: Prostate is unremarkable. Other: Trace fluid noted within the dependent portion of the pelvis. Musculoskeletal: Spondylosis identified within the lumbar spine. No acute or suspicious osseous findings. IMPRESSION: 1. Right-sided hydronephrosis and hydroureter secondary to a 2-3 mm right UVJ calculus. 2. Nonobstructing left nephrolithiasis. 3. Indeterminate 1.7 cm exophytic lesion arising off the lateral cortex of the left kidney. This does not meet criteria for a simple cyst. When the patient is clinically stable and able to follow directions and hold their breath (preferably as an outpatient) further evaluation with dedicated abdominal MRI  without and with contrast material should be considered. 4. Cardiomegaly with small pericardial effusion. 5. Sigmoid diverticulosis without signs of acute diverticulitis. 6. Aortic Atherosclerosis (ICD10-I70.0). Electronically Signed   By: Kerby Moors M.D.   On: 12/27/2021 06:47     Assessment/Plan Principal Problem:   Acute unilateral obstructive uropathy Active Problems:   Renal colic on right side   Hypertensive urgency   Chronic kidney disease   AKI (acute kidney injury) ([place])   Nicotine dependence   Non compliance w medication regimen     Patient is a 61 year old male admitted to the hospital for right-sided obstructive uropathy.   Acute unilateral obstructive uropathy with AKI Patient presents for evaluation of right flank pain with radiation to the right groin and imaging shows right-sided hydronephrosis with a 2 to 3 mm right UVJ calculus. At baseline patient has stage III chronic kidney disease with serum creatinine between 1.6 -1.81 today on admission his creatinine is 4.64. Judicious IV fluid resuscitation Urology consult for stent  placement    Hypertensive urgency with complications of stage III chronic kidney disease Secondary to medication noncompliance Patient has a known history of hypertension but has not taken his medication in about 4 weeks Resume carvedilol, amlodipine and hydralazine Hold Cozaar due to worsening renal function Will obtain 2D echocardiogram to assess LVEF since patient has complaints of shortness of breath mostly with exertion and imaging shows cardiomegaly.   Nicotine dependence Smoking cessation has been discussed with patient in detail Will place patient on a nicotine transdermal patch 21 mg daily   DVT prophylaxis: SCD  Code Status: full code  Family Communication: Greater than 50% of time was spent discussing patient's condition and plan of care with him at the bedside.  All questions and concerns have been addressed.  He verbalizes understanding and agrees to the plan. Disposition Plan: Back to previous home environment Consults called: Urology Status:At the time of admission, it appears that the appropriate admission status for this patient is inpatient. This is judged to be reasonable and necessary to provide the required intensity of service to ensure the patient's safety given the presenting symptoms, physical exam findings, and initial radiographic and laboratory data in the context of their comorbid conditions. Patient requires inpatient status due to high intensity of service, high risk for further deterioration and high frequency of surveillance required.     Collier Bullock MD [facility]     12/27/2021, 8:55 AM

## 2021-12-27 NOTE — Consult Note (Signed)
Urology Consult  I have been asked to see the patient by Dr. Jari Pigg, for evaluation and management of acute on chronic renal insufficiency, right distal ureteral calculus.  Chief Complaint: Right flank pain  History of Present Illness: Ricky Simmons is a 61 y.o. year old male presenting to the emergency room with right flank pain x 3 days found to have a distal 2 to 3 mm right obstructing UVJ stone.  In addition to this, he has poorly controlled hypertension, 190/138 and some mild tachycardia.  He reports that he has not been taking his blood pressure for 4 days.  Labs indicate acute on chronic renal insufficiency with creatinine elevated to 4.64.  He has known baseline stage III CKD, baseline creatinine appears to be around 1.6 followed by Dr. Candiss Norse, last seen a year ago.  He is also struggling with some shortness of breath with lying flat.  His BNP is markedly elevated.  He denies any dysuria, fevers or chills.  His urinalysis is negative and no significant leukocytosis.  He does have a remote history of stones but never required surgical intervention for these.  He does report continued right lower quadrant pain is requesting further pain medicine.  Initially pain medicine was effective but now it starting to wear off.  He notes that the pain is lower in his bladder.  He is having some urgency and associated right testicular pain.    Past Medical History:  Diagnosis Date   Chronic kidney disease    STAGE 3-FOLLOWED BY PCP   GERD (gastroesophageal reflux disease)    RARE   History of kidney stones    H/O   Hypertension    Penile fracture 1989    Past Surgical History:  Procedure Laterality Date   COLONOSCOPY     HEMORRHOID SURGERY N/A 05/07/2018   Procedure: HEMORRHOIDECTOMY;  Surgeon: Herbert Pun, MD;  Location: [facility] ORS;  Service: General;  Laterality: N/A;   TONSILLECTOMY     AGE 24    Home Medications:  No outpatient medications have been marked as  taking for the 12/27/21 encounter [facility] Encounter).    Allergies: No Known Allergies  History reviewed. No pertinent family history.  Social History:  reports that he has been smoking cigarettes. He has never used smokeless tobacco. He reports that he does not drink alcohol and does not use drugs.  ROS: A complete review of systems was performed.  All systems are negative except for pertinent findings as noted.  Physical Exam:  Vital signs in last 24 hours: Temp:  [98.1 F (36.7 C)-98.4 F (36.9 C)] 98.1 F (36.7 C) (01/19 0717) Pulse Rate:  [87-103] 87 (01/19 0710) Resp:  [18-26] 26 (01/19 0710) BP: (188-199)/(128-142) 188/128 (01/19 0710) SpO2:  [96 %-98 %] 97 % (01/19 0710) Weight:  [72.6 kg] 72.6 kg (01/19 0351) Constitutional:  Alert and oriented, No acute distress HEENT: [place] AT, moist mucus membranes.  Trachea midline, no masses GI: Abdomen is tender in the right lower quadrant Neurologic: Grossly intact, no focal deficits, moving all 4 extremities Psychiatric: Normal mood and affect   Laboratory Data:  Recent Labs    12/27/21 0404  WBC 12.6*  HGB 11.5*  HCT 35.2*   Recent Labs    12/27/21 0404  NA 134*  K 4.7  CL 109  CO2 19*  GLUCOSE 119*  BUN 49*  CREATININE 4.64*  CALCIUM 8.6*     Radiologic Imaging: DG Chest 2 View  Result  Date: 12/27/2021 CLINICAL DATA:  61 year old male with shortness of breath. Right flank pain and hematuria. EXAM: CHEST - 2 VIEW COMPARISON:  None. FINDINGS: Cardiomegaly. Mildly tortuous thoracic aorta. Other mediastinal contours are within normal limits. Visualized tracheal air column is within normal limits. Normal lung volumes. No pneumothorax, pulmonary edema, pleural effusion or confluent pulmonary opacity. No acute osseous abnormality identified. Negative visible bowel gas. IMPRESSION: Cardiomegaly.  No acute cardiopulmonary abnormality. Electronically Signed   By: Genevie Ann M.D.   On: 12/27/2021 04:33   CT Renal Stone  Study  Result Date: 12/27/2021 CLINICAL DATA:  Flank pain.  Evaluate for kidney stone EXAM: CT ABDOMEN AND PELVIS WITHOUT CONTRAST TECHNIQUE: Multidetector CT imaging of the abdomen and pelvis was performed following the standard protocol without IV contrast. RADIATION DOSE REDUCTION: This exam was performed according to the departmental dose-optimization program which includes automated exposure control, adjustment of the mA and/or kV according to patient size and/or use of iterative reconstruction technique. COMPARISON:  None. FINDINGS: Lower chest: Heart size is enlarged. No pleural effusion or edema. Small pericardial effusion noted. Hepatobiliary: No focal liver abnormality is seen. No gallstones, gallbladder wall thickening, or biliary dilatation. Pancreas: Unremarkable. No pancreatic ductal dilatation or surrounding inflammatory changes. Spleen: Normal in size without focal abnormality. Adrenals/Urinary Tract: Normal adrenal glands. There is a tiny stone within the right UVJ measuring 2-3 mm, image 80/2. This results in mild right hydronephrosis and hydroureter with perinephric fat stranding. Several punctate stones are identified within the upper pole the left kidney. No left-sided hydronephrosis. -Within the upper pole of the right kidney there is a 2.5 cm, 9.55 Hounsfield units, image 36/2. This is compatible with a benign cyst. -Arising off the lateral cortex of the left kidney is a 1.7 cm exophytic lesion measuring 29 Hounsfield units, image 39/2. This does not meet criteria for a simple cyst. Stomach/Bowel: Stomach is within normal limits. Appendix appears normal. No evidence of bowel wall thickening, distention, or inflammatory changes. Sigmoid diverticulosis without signs of acute diverticulitis. Vascular/Lymphatic: Aortic atherosclerosis. No signs of abdominopelvic adenopathy. Reproductive: Prostate is unremarkable. Other: Trace fluid noted within the dependent portion of the pelvis.  Musculoskeletal: Spondylosis identified within the lumbar spine. No acute or suspicious osseous findings. IMPRESSION: 1. Right-sided hydronephrosis and hydroureter secondary to a 2-3 mm right UVJ calculus. 2. Nonobstructing left nephrolithiasis. 3. Indeterminate 1.7 cm exophytic lesion arising off the lateral cortex of the left kidney. This does not meet criteria for a simple cyst. When the patient is clinically stable and able to follow directions and hold their breath (preferably as an outpatient) further evaluation with dedicated abdominal MRI without and with contrast material should be considered. 4. Cardiomegaly with small pericardial effusion. 5. Sigmoid diverticulosis without signs of acute diverticulitis. 6. Aortic Atherosclerosis (ICD10-I70.0). Electronically Signed   By: Kerby Moors M.D.   On: 12/27/2021 06:47    CT scan was personally reviewed today, agree with radiologic interpretation.  Impression/ Plan:  1.  Acute on chronic renal insufficiency-very much likely multifactorial given poorly controlled blood pressure, prerenal dehydration and some component of urinary obstruction although the obstruction appears to be to a lower degree and unilateral and thus not the likely sole factor in his current presentation  Plan to avoid nephrotoxic agent, hydrate as appropriate, supportive care, consideration of nephrology consult and consideration of stone management as outlined below.  2.  Right distal ureteral calculus- very small 2 to 3 mm right distal UVJ stone.  Specifically, this is an excellent chance  of passing spontaneously.  He does continue to have some discomfort and in the setting of #1, could offer ureteroscopy, laser lithotripsy to go ahead and expedite clearance of the stone and his recovery.  That being said, I discussed the case with Dr. Francine Graven his hospitalist.  He is not currently optimized for the OR in light of his poorly controlled hypertension and BMP with need for cardiac  echo prior to proceeding with any form of anesthesia.  As such, this would need to be delayed till this evening versus tomorrow if we do elect to proceed with this.  We did go ahead and discussed this as an option including the risk and benefits including risk of bleeding, infection, damage surrounding structures, need for ureteral stent or staged procedure, damage to the ureter tube amongst others.  All questions were answered.  We will go and start him on Flomax and have him strain his urine consistently.  We will get a KUB for baseline to see if we can see the stone radiographically in order to help follow it.   12/27/2021, 7:30 AM  Hollice Espy,  MD

## 2021-12-27 NOTE — H&P (View-Only) (Signed)
Urology Consult  I have been asked to see the patient by Dr. Jari Pigg, for evaluation and management of acute on chronic renal insufficiency, right distal ureteral calculus.  Chief Complaint: Right flank pain  History of Present Illness: Ricky Simmons is a 61 y.o. year old male presenting to the emergency room with right flank pain x 3 days found to have a distal 2 to 3 mm right obstructing UVJ stone.  In addition to this, he has poorly controlled hypertension, 190/138 and some mild tachycardia.  He reports that he has not been taking his blood pressure for 4 days.  Labs indicate acute on chronic renal insufficiency with creatinine elevated to 4.64.  He has known baseline stage III CKD, baseline creatinine appears to be around 1.6 followed by Dr. Candiss Norse, last seen a year ago.  He is also struggling with some shortness of breath with lying flat.  His BNP is markedly elevated.  He denies any dysuria, fevers or chills.  His urinalysis is negative and no significant leukocytosis.  He does have a remote history of stones but never required surgical intervention for these.  He does report continued right lower quadrant pain is requesting further pain medicine.  Initially pain medicine was effective but now it starting to wear off.  He notes that the pain is lower in his bladder.  He is having some urgency and associated right testicular pain.    Past Medical History:  Diagnosis Date   Chronic kidney disease    STAGE 3-FOLLOWED BY PCP   GERD (gastroesophageal reflux disease)    RARE   History of kidney stones    H/O   Hypertension    Penile fracture 1989    Past Surgical History:  Procedure Laterality Date   COLONOSCOPY     HEMORRHOID SURGERY N/A 05/07/2018   Procedure: HEMORRHOIDECTOMY;  Surgeon: Herbert Pun, MD;  Location: [facility] ORS;  Service: General;  Laterality: N/A;   TONSILLECTOMY     AGE 61    Home Medications:  No outpatient medications have been marked as  taking for the 12/27/21 encounter [facility] Encounter).    Allergies: No Known Allergies  History reviewed. No pertinent family history.  Social History:  reports that he has been smoking cigarettes. He has never used smokeless tobacco. He reports that he does not drink alcohol and does not use drugs.  ROS: A complete review of systems was performed.  All systems are negative except for pertinent findings as noted.  Physical Exam:  Vital signs in last 24 hours: Temp:  [98.1 F (36.7 C)-98.4 F (36.9 C)] 98.1 F (36.7 C) (01/19 0717) Pulse Rate:  [87-103] 87 (01/19 0710) Resp:  [18-26] 26 (01/19 0710) BP: (188-199)/(128-142) 188/128 (01/19 0710) SpO2:  [96 %-98 %] 97 % (01/19 0710) Weight:  [72.6 kg] 72.6 kg (01/19 0351) Constitutional:  Alert and oriented, No acute distress HEENT:  AT, moist mucus membranes.  Trachea midline, no masses GI: Abdomen is tender in the right lower quadrant Neurologic: Grossly intact, no focal deficits, moving all 4 extremities Psychiatric: Normal mood and affect   Laboratory Data:  Recent Labs    12/27/21 0404  WBC 12.6*  HGB 11.5*  HCT 35.2*   Recent Labs    12/27/21 0404  NA 134*  K 4.7  CL 109  CO2 19*  GLUCOSE 119*  BUN 49*  CREATININE 4.64*  CALCIUM 8.6*     Radiologic Imaging: DG Chest 2 View  Result  Date: 12/27/2021 CLINICAL DATA:  61 year old male with shortness of breath. Right flank pain and hematuria. EXAM: CHEST - 2 VIEW COMPARISON:  None. FINDINGS: Cardiomegaly. Mildly tortuous thoracic aorta. Other mediastinal contours are within normal limits. Visualized tracheal air column is within normal limits. Normal lung volumes. No pneumothorax, pulmonary edema, pleural effusion or confluent pulmonary opacity. No acute osseous abnormality identified. Negative visible bowel gas. IMPRESSION: Cardiomegaly.  No acute cardiopulmonary abnormality. Electronically Signed   By: Genevie Ann M.D.   On: 12/27/2021 04:33   CT Renal Stone  Study  Result Date: 12/27/2021 CLINICAL DATA:  Flank pain.  Evaluate for kidney stone EXAM: CT ABDOMEN AND PELVIS WITHOUT CONTRAST TECHNIQUE: Multidetector CT imaging of the abdomen and pelvis was performed following the standard protocol without IV contrast. RADIATION DOSE REDUCTION: This exam was performed according to the departmental dose-optimization program which includes automated exposure control, adjustment of the mA and/or kV according to patient size and/or use of iterative reconstruction technique. COMPARISON:  None. FINDINGS: Lower chest: Heart size is enlarged. No pleural effusion or edema. Small pericardial effusion noted. Hepatobiliary: No focal liver abnormality is seen. No gallstones, gallbladder wall thickening, or biliary dilatation. Pancreas: Unremarkable. No pancreatic ductal dilatation or surrounding inflammatory changes. Spleen: Normal in size without focal abnormality. Adrenals/Urinary Tract: Normal adrenal glands. There is a tiny stone within the right UVJ measuring 2-3 mm, image 80/2. This results in mild right hydronephrosis and hydroureter with perinephric fat stranding. Several punctate stones are identified within the upper pole the left kidney. No left-sided hydronephrosis. -Within the upper pole of the right kidney there is a 2.5 cm, 9.55 Hounsfield units, image 36/2. This is compatible with a benign cyst. -Arising off the lateral cortex of the left kidney is a 1.7 cm exophytic lesion measuring 29 Hounsfield units, image 39/2. This does not meet criteria for a simple cyst. Stomach/Bowel: Stomach is within normal limits. Appendix appears normal. No evidence of bowel wall thickening, distention, or inflammatory changes. Sigmoid diverticulosis without signs of acute diverticulitis. Vascular/Lymphatic: Aortic atherosclerosis. No signs of abdominopelvic adenopathy. Reproductive: Prostate is unremarkable. Other: Trace fluid noted within the dependent portion of the pelvis.  Musculoskeletal: Spondylosis identified within the lumbar spine. No acute or suspicious osseous findings. IMPRESSION: 1. Right-sided hydronephrosis and hydroureter secondary to a 2-3 mm right UVJ calculus. 2. Nonobstructing left nephrolithiasis. 3. Indeterminate 1.7 cm exophytic lesion arising off the lateral cortex of the left kidney. This does not meet criteria for a simple cyst. When the patient is clinically stable and able to follow directions and hold their breath (preferably as an outpatient) further evaluation with dedicated abdominal MRI without and with contrast material should be considered. 4. Cardiomegaly with small pericardial effusion. 5. Sigmoid diverticulosis without signs of acute diverticulitis. 6. Aortic Atherosclerosis (ICD10-I70.0). Electronically Signed   By: Kerby Moors M.D.   On: 12/27/2021 06:47    CT scan was personally reviewed today, agree with radiologic interpretation.  Impression/ Plan:  1.  Acute on chronic renal insufficiency-very much likely multifactorial given poorly controlled blood pressure, prerenal dehydration and some component of urinary obstruction although the obstruction appears to be to a lower degree and unilateral and thus not the likely sole factor in his current presentation  Plan to avoid nephrotoxic agent, hydrate as appropriate, supportive care, consideration of nephrology consult and consideration of stone management as outlined below.  2.  Right distal ureteral calculus- very small 2 to 3 mm right distal UVJ stone.  Specifically, this is an excellent chance  of passing spontaneously.  He does continue to have some discomfort and in the setting of #1, could offer ureteroscopy, laser lithotripsy to go ahead and expedite clearance of the stone and his recovery.  That being said, I discussed the case with Dr. Francine Graven his hospitalist.  He is not currently optimized for the OR in light of his poorly controlled hypertension and BMP with need for cardiac  echo prior to proceeding with any form of anesthesia.  As such, this would need to be delayed till this evening versus tomorrow if we do elect to proceed with this.  We did go ahead and discussed this as an option including the risk and benefits including risk of bleeding, infection, damage surrounding structures, need for ureteral stent or staged procedure, damage to the ureter tube amongst others.  All questions were answered.  We will go and start him on Flomax and have him strain his urine consistently.  We will get a KUB for baseline to see if we can see the stone radiographically in order to help follow it.   12/27/2021, 7:30 AM  Hollice Espy,  MD

## 2021-12-27 NOTE — ED Notes (Signed)
Patient returned from CT

## 2021-12-27 NOTE — ED Notes (Signed)
ED Provider at bedside. 

## 2021-12-28 ENCOUNTER — Inpatient Hospital Stay: Payer: Managed Care, Other (non HMO) | Admitting: Anesthesiology

## 2021-12-28 ENCOUNTER — Inpatient Hospital Stay: Payer: Managed Care, Other (non HMO)

## 2021-12-28 ENCOUNTER — Encounter: Payer: Self-pay | Admitting: Internal Medicine

## 2021-12-28 ENCOUNTER — Inpatient Hospital Stay
Admit: 2021-12-28 | Discharge: 2021-12-28 | Disposition: A | Discharge status: Home / Self Care | Payer: Managed Care, Other (non HMO) | Attending: Internal Medicine | Admitting: Internal Medicine

## 2021-12-28 ENCOUNTER — Encounter
Admission: EM | Disposition: A | Discharge status: Home / Self Care | Payer: Self-pay | Source: Home / Self Care | Attending: Internal Medicine

## 2021-12-28 DIAGNOSIS — N201 Calculus of ureter: Secondary | ICD-10-CM | POA: Diagnosis not present

## 2021-12-28 DIAGNOSIS — N2889 Other specified disorders of kidney and ureter: Secondary | ICD-10-CM

## 2021-12-28 DIAGNOSIS — N179 Acute kidney failure, unspecified: Secondary | ICD-10-CM

## 2021-12-28 DIAGNOSIS — I10 Essential (primary) hypertension: Secondary | ICD-10-CM

## 2021-12-28 DIAGNOSIS — I5022 Chronic systolic (congestive) heart failure: Secondary | ICD-10-CM

## 2021-12-28 DIAGNOSIS — I5023 Acute on chronic systolic (congestive) heart failure: Secondary | ICD-10-CM

## 2021-12-28 HISTORY — PX: CYSTOSCOPY/URETEROSCOPY/HOLMIUM LASER/STENT PLACEMENT: SHX6546

## 2021-12-28 LAB — CBC
HCT: 33.3 % — ABNORMAL LOW (ref 39.0–52.0)
Hemoglobin: 10.9 g/dL — ABNORMAL LOW (ref 13.0–17.0)
MCH: 30.1 pg (ref 26.0–34.0)
MCHC: 32.7 g/dL (ref 30.0–36.0)
MCV: 92 fL (ref 80.0–100.0)
Platelets: 177 10*3/uL (ref 150–400)
RBC: 3.62 MIL/uL — ABNORMAL LOW (ref 4.22–5.81)
RDW: 14.3 % (ref 11.5–15.5)
WBC: 9.5 10*3/uL (ref 4.0–10.5)
nRBC: 0 % (ref 0.0–0.2)

## 2021-12-28 LAB — BASIC METABOLIC PANEL
Anion gap: 7 (ref 5–15)
BUN: 54 mg/dL — ABNORMAL HIGH (ref 6–20)
CO2: 18 mmol/L — ABNORMAL LOW (ref 22–32)
Calcium: 8.3 mg/dL — ABNORMAL LOW (ref 8.9–10.3)
Chloride: 110 mmol/L (ref 98–111)
Creatinine, Ser: 5.16 mg/dL — ABNORMAL HIGH (ref 0.61–1.24)
GFR, Estimated: 12 mL/min — ABNORMAL LOW (ref >60)
Glucose, Bld: 99 mg/dL (ref 70–99)
Potassium: 4.8 mmol/L (ref 3.5–5.1)
Sodium: 135 mmol/L (ref 135–145)

## 2021-12-28 LAB — ECHOCARDIOGRAM COMPLETE
AR max vel: 2.03 cm2
AV Area VTI: 2.27 cm2
AV Area mean vel: 2.14 cm2
AV Mean grad: 5 mmHg
AV Peak grad: 8.8 mmHg
Ao pk vel: 1.48 m/s
Area-P 1/2: 7.66 cm2
Calc EF: 45.9 %
Height: 74.5 in
MV VTI: 2.38 cm2
S' Lateral: 4.6 cm
Single Plane A2C EF: 42.5 %
Single Plane A4C EF: 48.3 %
Weight: 2560 oz

## 2021-12-28 LAB — URINE CULTURE: Culture: NO GROWTH

## 2021-12-28 SURGERY — CYSTOSCOPY/URETEROSCOPY/HOLMIUM LASER/STENT PLACEMENT
Anesthesia: General | Laterality: Right

## 2021-12-28 MED ORDER — LIDOCAINE HCL URETHRAL/MUCOSAL 2 % EX GEL
CUTANEOUS | Status: AC
  Filled 2021-12-28: qty 5

## 2021-12-28 MED ORDER — GLYCOPYRROLATE 0.2 MG/ML IJ SOLN
INTRAMUSCULAR | Status: DC | PRN
  Administered 2021-12-28: .1 mg via INTRAVENOUS

## 2021-12-28 MED ORDER — EPHEDRINE SULFATE (PRESSORS) 50 MG/ML IJ SOLN
INTRAMUSCULAR | Status: DC | PRN
  Administered 2021-12-28: 15 mg via INTRAVENOUS

## 2021-12-28 MED ORDER — OXYCODONE HCL 5 MG/5ML PO SOLN: 5.0000 mg | Freq: Once | ORAL | Status: DC | PRN

## 2021-12-28 MED ORDER — PROPOFOL 10 MG/ML IV BOLUS
INTRAVENOUS | Status: AC
  Filled 2021-12-28: qty 20

## 2021-12-28 MED ORDER — OXYCODONE HCL 5 MG PO TABS: 5.0000 mg | ORAL_TABLET | Freq: Once | ORAL | Status: DC | PRN

## 2021-12-28 MED ORDER — GLYCOPYRROLATE 0.2 MG/ML IJ SOLN
INTRAMUSCULAR | Status: AC
  Filled 2021-12-28: qty 1

## 2021-12-28 MED ORDER — EPHEDRINE 5 MG/ML INJ
INTRAVENOUS | Status: AC
  Filled 2021-12-28: qty 5

## 2021-12-28 MED ORDER — ACETAMINOPHEN 10 MG/ML IV SOLN
INTRAVENOUS | Status: AC
  Filled 2021-12-28: qty 100

## 2021-12-28 MED ORDER — FENTANYL CITRATE (PF) 100 MCG/2ML IJ SOLN
INTRAMUSCULAR | Status: AC
  Filled 2021-12-28: qty 2

## 2021-12-28 MED ORDER — ADULT MULTIVITAMIN W/MINERALS CH
1.0000 | ORAL_TABLET | Freq: Every day | ORAL | Status: DC
  Administered 2021-12-29 – 2022-01-02 (×5): 1 via ORAL
  Filled 2021-12-28 (×6): qty 1

## 2021-12-28 MED ORDER — FENTANYL CITRATE (PF) 100 MCG/2ML IJ SOLN: 25.0000 ug | INTRAMUSCULAR | Status: DC | PRN

## 2021-12-28 MED ORDER — DEXMEDETOMIDINE (PRECEDEX) IN NS 20 MCG/5ML (4 MCG/ML) IV SYRINGE
PREFILLED_SYRINGE | INTRAVENOUS | Status: DC | PRN
  Administered 2021-12-28: 20 ug via INTRAVENOUS

## 2021-12-28 MED ORDER — LACTATED RINGERS IV SOLN
INTRAVENOUS | Status: DC | PRN
Start: 2021-12-28 — End: 2021-12-28

## 2021-12-28 MED ORDER — SODIUM CHLORIDE 0.9 % IR SOLN
Status: DC | PRN
  Administered 2021-12-28: 1000 mL

## 2021-12-28 MED ORDER — ENSURE ENLIVE PO LIQD
237.0000 mL | Freq: Three times a day (TID) | ORAL | Status: DC
  Administered 2021-12-29 – 2022-01-02 (×10): 237 mL via ORAL
  Filled 2021-12-28 (×2): qty 237

## 2021-12-28 MED ORDER — CEFAZOLIN SODIUM 1 G IJ SOLR
INTRAMUSCULAR | Status: AC
  Filled 2021-12-28: qty 20

## 2021-12-28 MED ORDER — SODIUM CHLORIDE (PF) 0.9 % IJ SOLN
INTRAMUSCULAR | Status: AC
  Filled 2021-12-28: qty 20

## 2021-12-28 MED ORDER — ACETAMINOPHEN 10 MG/ML IV SOLN
INTRAVENOUS | Status: DC | PRN
  Administered 2021-12-28: 1000 mg via INTRAVENOUS

## 2021-12-28 MED ORDER — LIDOCAINE HCL (CARDIAC) PF 100 MG/5ML IV SOSY
PREFILLED_SYRINGE | INTRAVENOUS | Status: DC | PRN
  Administered 2021-12-28: 100 mg via INTRAVENOUS

## 2021-12-28 MED ORDER — MIDAZOLAM HCL 2 MG/2ML IJ SOLN
INTRAMUSCULAR | Status: AC
  Filled 2021-12-28: qty 2

## 2021-12-28 MED ORDER — PROPOFOL 500 MG/50ML IV EMUL
INTRAVENOUS | Status: DC | PRN
  Administered 2021-12-28: 150 ug/kg/min via INTRAVENOUS

## 2021-12-28 MED ORDER — ONDANSETRON HCL 4 MG/2ML IJ SOLN
INTRAMUSCULAR | Status: DC | PRN
  Administered 2021-12-28: 4 mg via INTRAVENOUS

## 2021-12-28 MED ORDER — ONDANSETRON HCL 4 MG/2ML IJ SOLN: 4.0000 mg | Freq: Once | INTRAMUSCULAR | Status: DC | PRN

## 2021-12-28 MED ORDER — MIDAZOLAM HCL 2 MG/2ML IJ SOLN
INTRAMUSCULAR | Status: DC | PRN
  Administered 2021-12-28: 2 mg via INTRAVENOUS

## 2021-12-28 MED ORDER — FENTANYL CITRATE (PF) 100 MCG/2ML IJ SOLN
INTRAMUSCULAR | Status: DC | PRN
  Administered 2021-12-28: 50 ug via INTRAVENOUS
  Administered 2021-12-28: 25 ug via INTRAVENOUS

## 2021-12-28 MED ORDER — CEFAZOLIN SODIUM-DEXTROSE 2-3 GM-%(50ML) IV SOLR
INTRAVENOUS | Status: DC | PRN
Start: 2021-12-28 — End: 2021-12-28
  Administered 2021-12-28: 2 g via INTRAVENOUS

## 2021-12-28 MED ORDER — PROPOFOL 10 MG/ML IV BOLUS
INTRAVENOUS | Status: DC | PRN
  Administered 2021-12-28 (×2): 50 mg via INTRAVENOUS

## 2021-12-28 MED ORDER — DEXAMETHASONE SODIUM PHOSPHATE 10 MG/ML IJ SOLN
INTRAMUSCULAR | Status: DC | PRN
  Administered 2021-12-28: 10 mg via INTRAVENOUS

## 2021-12-28 MED ORDER — LACTATED RINGERS IV SOLN: INTRAVENOUS | Status: DC

## 2021-12-28 MED ORDER — ACETAMINOPHEN 10 MG/ML IV SOLN: 1000.0000 mg | Freq: Once | INTRAVENOUS | Status: DC | PRN

## 2021-12-28 SURGICAL SUPPLY — 33 items
ADHESIVE MASTISOL STRL (MISCELLANEOUS) IMPLANT
BAG DRAIN CYSTO-URO LG1000N (MISCELLANEOUS) ×3 IMPLANT
BRUSH SCRUB EZ 1% IODOPHOR (MISCELLANEOUS) ×3 IMPLANT
CATH URET FLEX-TIP 2 LUMEN 10F (CATHETERS) IMPLANT
CATH URETL OPEN 5X70 (CATHETERS) ×2 IMPLANT
CNTNR SPEC 2.5X3XGRAD LEK (MISCELLANEOUS) ×1
CONT SPEC 4OZ STER OR WHT (MISCELLANEOUS) ×2
CONT SPEC 4OZ STRL OR WHT (MISCELLANEOUS) ×1
CONTAINER SPEC 2.5X3XGRAD LEK (MISCELLANEOUS) IMPLANT
DRAPE UTILITY 15X26 TOWEL STRL (DRAPES) ×3 IMPLANT
DRSG TEGADERM 2-3/8X2-3/4 SM (GAUZE/BANDAGES/DRESSINGS) IMPLANT
GAUZE 4X4 16PLY ~~LOC~~+RFID DBL (SPONGE) ×6 IMPLANT
GLOVE SURG UNDER POLY LF SZ7.5 (GLOVE) ×3 IMPLANT
GOWN STRL REUS W/ TWL LRG LVL3 (GOWN DISPOSABLE) ×1 IMPLANT
GOWN STRL REUS W/ TWL XL LVL3 (GOWN DISPOSABLE) ×1 IMPLANT
GOWN STRL REUS W/TWL LRG LVL3 (GOWN DISPOSABLE) ×3
GOWN STRL REUS W/TWL XL LVL3 (GOWN DISPOSABLE) ×3
GUIDEWIRE STR DUAL SENSOR (WIRE) ×3 IMPLANT
INFUSOR MANOMETER BAG 3000ML (MISCELLANEOUS) ×3 IMPLANT
IV NS IRRIG 3000ML ARTHROMATIC (IV SOLUTION) ×3 IMPLANT
KIT TURNOVER CYSTO (KITS) ×3 IMPLANT
PACK CYSTO AR (MISCELLANEOUS) ×3 IMPLANT
SET CYSTO W/LG BORE CLAMP LF (SET/KITS/TRAYS/PACK) ×3 IMPLANT
SHEATH URETERAL 12FRX35CM (MISCELLANEOUS) IMPLANT
STENT URET 6FRX24 CONTOUR (STENTS) IMPLANT
STENT URET 6FRX26 CONTOUR (STENTS) IMPLANT
STENT URET 6FRX28 CONTOUR (STENTS) ×2 IMPLANT
SURGILUBE 2OZ TUBE FLIPTOP (MISCELLANEOUS) ×3 IMPLANT
SYR 10ML LL (SYRINGE) ×3 IMPLANT
TRACTIP FLEXIVA PULSE ID 200 (Laser) IMPLANT
VALVE UROSEAL ADJ ENDO (VALVE) IMPLANT
WATER STERILE IRR 1000ML POUR (IV SOLUTION) ×3 IMPLANT
WATER STERILE IRR 500ML POUR (IV SOLUTION) ×3 IMPLANT

## 2021-12-28 NOTE — Progress Notes (Signed)
Initial Nutrition Assessment  DOCUMENTATION CODES:   Not applicable  INTERVENTION:   Ensure Enlive po TID, each supplement provides 350 kcal and 20 grams of protein  MVI po daily   Pt at refeed risk; recommend monitor potassium, magnesium and phosphorus labs daily until stable  NUTRITION DIAGNOSIS:   Inadequate oral intake related to acute illness as evidenced by per patient/family report.  GOAL:   Patient will meet greater than or equal to 90% of their needs  MONITOR:   Diet advancement, Labs, Weight trends, Skin, I & O's  REASON FOR ASSESSMENT:   Malnutrition Screening Tool    ASSESSMENT:   61 y.o. male with medical history significant for nephrolithiasis, CHF, hypertension, chronic kidney disease stage III, nicotine dependence and medication noncompliance who is admitted with R ureteral stone.  Unable to see pt today as pt in procedure at time of RD visit. Per chart review, pt reports poor oral intake and weight loss pta; RD unsure how long this was going on for. Pt currently NPO for right ureteroscopy, laser lithotripsy and stent placement today. There is no recent weight history in chart to determine if any significant recent changes. Pt does appear weight stable from his last documented weight from May 2021. RD will add supplements and MVI to help pt meet his estimated needs. RD will obtain nutrition related exam and history at follow up.   Medications reviewed and include: nicotine  Labs reviewed: K 4.8 wnl, BUN 54(H), creat 5.16(H) Hgb 10.9(L), Hct 33.3(L)  NUTRITION - FOCUSED PHYSICAL EXAM: Unable to perform at this time   Diet Order:   Diet Order             Diet NPO time specified  Diet effective midnight                  EDUCATION NEEDS:   No education needs have been identified at this time  Skin:  Skin Assessment: Reviewed RN Assessment  Last BM:  1/18  Height:   Ht Readings from Last 1 Encounters:  12/27/21 6' 2.5" (1.892 m)     Weight:   Wt Readings from Last 1 Encounters:  12/27/21 72.6 kg    Ideal Body Weight:  87.7 kg  BMI:  Body mass index is 20.27 kg/m.  Estimated Nutritional Needs:   Kcal:  2100-2400kcal/day  Protein:  105-120g/day  Fluid:  2.2-2.5L/day  Koleen Distance MS, RD, LDN Please refer to [facility] for RD and/or RD on-call/weekend/after hours pager

## 2021-12-28 NOTE — Progress Notes (Signed)
[facility]  ROUNDING NOTE   Subjective:   Ricky Simmons is a 61 y.o. male with a past medical history of hypertension, nephrolithiasis, and chronic kidney disease stage III. He presents to the ED with complaints of worsening pain on the right side. He has been admitted for Kidney stone [N36.1] Renal colic on right side [W43] AKI (acute kidney injury) ([place]) [N17.9] Hypertensive emergency [I16.1]  Patient is known to our practice and receives outpatient care from Dr Candiss Norse. He states was sick with congestion and productive cough previous to admission. He states had ran out of his medications. States pain is intermittent and travels to groin. Reports hematuria, urgency, and dysuria. Denies nausea, vomiting, and diarrhea. Denies chest pain. Cough remains, non productive, but denies shortness of breath.    Labs include sodium 134, glucose 119, creatinine 4.64 with GFR 14 and BNP 2543. Chest x-ray negative for acute changes. CT renal stone shows right sided hydronephrosis of 2-80mm right UVJ calculus.  Objective:  Vital signs in last 24 hours:  Temp:  [98.1 F (36.7 C)-98.6 F (37 C)] 98.2 F (36.8 C) (01/20 0752) Pulse Rate:  [82-88] 84 (01/20 0752) Resp:  [16] 16 (01/20 0752) BP: (124-174)/(76-116) 171/91 (01/20 0752) SpO2:  [95 %-99 %] 95 % (01/20 0752)  Weight change:  Filed Weights   12/27/21 0351  Weight: 72.6 kg    Intake/Output: I/O last 3 completed shifts: In: 428 [I.V.:428] Out: 2700 [Urine:2700]   Intake/Output this shift:  No intake/output data recorded.  Physical Exam: General: NAD, resting comfortably  Head: Normocephalic, atraumatic. Moist oral mucosal membranes  Eyes: Anicteric  Lungs:  Clear to auscultation, normal effort  Heart: Regular rate and rhythm  Abdomen:  Soft, nontender  Extremities:  no peripheral edema.  Neurologic: Nonfocal, moving all four extremities  Skin: No lesions       Basic Metabolic Panel: Recent Labs  Lab  12/27/21 0404 12/28/21 0440  NA 134* 135  K 4.7 4.8  CL 109 110  CO2 19* 18*  GLUCOSE 119* 99  BUN 49* 54*  CREATININE 4.64* 5.16*  CALCIUM 8.6* 8.3*    Liver Function Tests: Recent Labs  Lab 12/27/21 0404  AST 19  ALT 19  ALKPHOS 52  BILITOT 0.6  PROT 6.6  ALBUMIN 3.1*   Recent Labs  Lab 12/27/21 0404  LIPASE 43   No results for input(s): AMMONIA in the last 168 hours.  CBC: Recent Labs  Lab 12/27/21 0404 12/28/21 0440  WBC 12.6* 9.5  HGB 11.5* 10.9*  HCT 35.2* 33.3*  MCV 93.4 92.0  PLT 216 177    Cardiac Enzymes: No results for input(s): CKTOTAL, CKMB, CKMBINDEX, TROPONINI in the last 168 hours.  BNP: Invalid input(s): POCBNP  CBG: No results for input(s): GLUCAP in the last 168 hours.  Microbiology: Results for orders placed or performed during the hospital encounter of 12/27/21  Urine Culture     Status: None   Collection Time: 12/27/21  5:50 AM   Specimen: Urine, Clean Catch  Result Value Ref Range Status   Specimen Description   Final    URINE, CLEAN CATCH Performed at [facility], [address] [place], [place] [address]    Special Requests   Final    NONE Performed at [facility], [address], [place], [place] [address]    Culture   Final    NO GROWTH Performed at [facility], [place] [address], [place], [place] [address]  Report Status 12/28/2021 FINAL  Final  Resp Panel by RT-PCR (Flu A&B, Covid) Nasopharyngeal Swab     Status: None   Collection Time: 12/27/21  7:17 AM   Specimen: Nasopharyngeal Swab; Nasopharyngeal(NP) swabs in vial transport medium  Result Value Ref Range Status   SARS Coronavirus 2 by RT PCR NEGATIVE NEGATIVE Final    Comment: (NOTE) SARS-CoV-2 target nucleic acids are NOT DETECTED.  The SARS-CoV-2 RNA is generally detectable in upper respiratory specimens during the acute phase of infection. The lowest concentration of SARS-CoV-2 viral copies this assay can detect is 138  copies/mL. A negative result does not preclude SARS-Cov-2 infection and should not be used as the sole basis for treatment or other patient management decisions. A negative result may occur with  improper specimen collection/handling, submission of specimen other than nasopharyngeal swab, presence of viral mutation(s) within the areas targeted by this assay, and inadequate number of viral copies(<138 copies/mL). A negative result must be combined with clinical observations, patient history, and epidemiological information. The expected result is Negative.  Fact Sheet for Patients:  EntrepreneurPulse.com.au  Fact Sheet for Healthcare Providers:  IncredibleEmployment.be  This test is no t yet approved or cleared by the [place] FDA and  has been authorized for detection and/or diagnosis of SARS-CoV-2 by FDA under an Emergency Use Authorization (EUA). This EUA will remain  in effect (meaning this test can be used) for the duration of the COVID-19 declaration under Section 564(b)(1) of the Act, 21 U.S.C.section 360bbb-3(b)(1), unless the authorization is terminated  or revoked sooner.       Influenza A by PCR NEGATIVE NEGATIVE Final   Influenza B by PCR NEGATIVE NEGATIVE Final    Comment: (NOTE) The Xpert Xpress SARS-CoV-2/FLU/RSV plus assay is intended as an aid in the diagnosis of influenza from Nasopharyngeal swab specimens and should not be used as a sole basis for treatment. Nasal washings and aspirates are unacceptable for Xpert Xpress SARS-CoV-2/FLU/RSV testing.  Fact Sheet for Patients: EntrepreneurPulse.com.au  Fact Sheet for Healthcare Providers: IncredibleEmployment.be  This test is not yet approved or cleared by the [place] FDA and has been authorized for detection and/or diagnosis of SARS-CoV-2 by FDA under an Emergency Use Authorization (EUA). This EUA will remain in effect (meaning  this test can be used) for the duration of the COVID-19 declaration under Section 564(b)(1) of the Act, 21 U.S.C. section 360bbb-3(b)(1), unless the authorization is terminated or revoked.  Performed at [facility], [address] [place], [place] [address]     Coagulation Studies: No results for input(s): LABPROT, INR in the last 72 hours.  Urinalysis: Recent Labs    12/27/21 0550  COLORURINE YELLOW*  LABSPEC 1.017  PHURINE 5.0  GLUCOSEU 50*  HGBUR SMALL*  BILIRUBINUR NEGATIVE  KETONESUR 5*  PROTEINUR >=300*  NITRITE NEGATIVE  LEUKOCYTESUR NEGATIVE      Imaging: DG Chest 2 View  Result Date: 12/27/2021 CLINICAL DATA:  61 year old male with shortness of breath. Right flank pain and hematuria. EXAM: CHEST - 2 VIEW COMPARISON:  None. FINDINGS: Cardiomegaly. Mildly tortuous thoracic aorta. Other mediastinal contours are within normal limits. Visualized tracheal air column is within normal limits. Normal lung volumes. No pneumothorax, pulmonary edema, pleural effusion or confluent pulmonary opacity. No acute osseous abnormality identified. Negative visible bowel gas. IMPRESSION: Cardiomegaly.  No acute cardiopulmonary abnormality. Electronically Signed   By: Genevie Ann M.D.   On: 12/27/2021 04:33   ECHOCARDIOGRAM COMPLETE  Result Date: 12/28/2021    ECHOCARDIOGRAM REPORT  Patient Name:   Ricky Simmons Date of Exam: 12/28/2021 Medical Rec #:  [id number]       Height:       74.5 in Accession #:    [id number]      Weight:       160.0 lb Date of Birth:  1961/11/14       BSA:          1.986 m Patient Age:    89 years        BP:           171/91 mmHg Patient Gender: M               HR:           84 bpm. Exam Location:  [facility] Procedure: 2D Echo, Cardiac Doppler and Color Doppler Indications:     CHF-acute diastolic J94.17  History:         Patient has no prior history of Echocardiogram examinations.                  Risk Factors:Hypertension. CKD.  Sonographer:     Sherrie Sport  Referring Phys:  [place] Diagnosing Phys: Serafina Royals MD IMPRESSIONS  1. Left ventricular ejection fraction, by estimation, is 30 to 35%. The left ventricle has moderately decreased function. The left ventricle demonstrates global hypokinesis. The left ventricular internal cavity size was mildly dilated. There is moderate  left ventricular hypertrophy. Left ventricular diastolic parameters are consistent with Grade II diastolic dysfunction (pseudonormalization).  2. Right ventricular systolic function is normal. The right ventricular size is normal.  3. Left atrial size was mild to moderately dilated.  4. The mitral valve is normal in structure. Moderate mitral valve regurgitation.  5. The aortic valve is normal in structure. Aortic valve regurgitation is trivial. FINDINGS  Left Ventricle: Left ventricular ejection fraction, by estimation, is 30 to 35%. The left ventricle has moderately decreased function. The left ventricle demonstrates global hypokinesis. The left ventricular internal cavity size was mildly dilated. There is moderate left ventricular hypertrophy. Left ventricular diastolic parameters are consistent with Grade II diastolic dysfunction (pseudonormalization). Right Ventricle: The right ventricular size is normal. No increase in right ventricular wall thickness. Right ventricular systolic function is normal. Left Atrium: Left atrial size was mild to moderately dilated. Right Atrium: Right atrial size was normal in size. Pericardium: Trivial pericardial effusion is present. Mitral Valve: The mitral valve is normal in structure. Moderate mitral valve regurgitation. MV peak gradient, 5.3 mmHg. The mean mitral valve gradient is 3.0 mmHg. Tricuspid Valve: The tricuspid valve is normal in structure. Tricuspid valve regurgitation is mild. Aortic Valve: The aortic valve is normal in structure. Aortic valve regurgitation is trivial. Aortic valve mean gradient measures 5.0 mmHg. Aortic valve  peak gradient measures 8.8 mmHg. Aortic valve area, by VTI measures 2.27 cm. Pulmonic Valve: The pulmonic valve was normal in structure. Pulmonic valve regurgitation is trivial. Aorta: The aortic root and ascending aorta are structurally normal, with no evidence of dilitation. IAS/Shunts: No atrial level shunt detected by color flow Doppler.  LEFT VENTRICLE PLAX 2D LVIDd:         6.10 cm      Diastology LVIDs:         4.60 cm      LV e' medial:    3.92 cm/s LV PW:         1.40 cm      LV E/e' medial:  17.7 LV IVS:  1.20 cm      LV e' lateral:   4.24 cm/s LVOT diam:     2.10 cm      LV E/e' lateral: 16.4 LV SV:         56 LV SV Index:   28 LVOT Area:     3.46 cm  LV Volumes (MOD) LV vol d, MOD A2C: 163.0 ml LV vol d, MOD A4C: 147.0 ml LV vol s, MOD A2C: 93.7 ml LV vol s, MOD A4C: 76.0 ml LV SV MOD A2C:     69.3 ml LV SV MOD A4C:     147.0 ml LV SV MOD BP:      74.6 ml RIGHT VENTRICLE RV Basal diam:  3.00 cm RV S prime:     17.30 cm/s TAPSE (M-mode): 4.0 cm LEFT ATRIUM              Index        RIGHT ATRIUM           Index LA diam:        4.40 cm  2.22 cm/m   RA Area:     27.70 cm LA Vol (A2C):   121.0 ml 60.93 ml/m  RA Volume:   98.70 ml  49.70 ml/m LA Vol (A4C):   103.0 ml 51.86 ml/m LA Biplane Vol: 114.0 ml 57.40 ml/m  AORTIC VALVE                    PULMONIC VALVE AV Area (Vmax):    2.03 cm     PV Vmax:        0.66 m/s AV Area (Vmean):   2.14 cm     PV Vmean:       39.550 cm/s AV Area (VTI):     2.27 cm     PV VTI:         0.092 m AV Vmax:           148.00 cm/s  PV Peak grad:   1.8 mmHg AV Vmean:          99.400 cm/s  PV Mean grad:   1.0 mmHg AV VTI:            0.249 m      RVOT Peak grad: 3 mmHg AV Peak Grad:      8.8 mmHg AV Mean Grad:      5.0 mmHg LVOT Vmax:         86.80 cm/s LVOT Vmean:        61.300 cm/s LVOT VTI:          0.163 m LVOT/AV VTI ratio: 0.65  AORTA Ao Root diam: 3.60 cm MITRAL VALVE                TRICUSPID VALVE MV Area (PHT): 7.66 cm     TR Peak grad:   17.0 mmHg MV Area  VTI:   2.38 cm     TR Vmax:        206.00 cm/s MV Peak grad:  5.3 mmHg MV Mean grad:  3.0 mmHg     SHUNTS MV Vmax:       1.15 m/s     Systemic VTI:  0.16 m MV Vmean:      75.6 cm/s    Systemic Diam: 2.10 cm MV Decel Time: 99 msec      Pulmonic VTI:  0.146 m MV E velocity: 69.40 cm/s MV A velocity: 122.00 cm/s MV E/A  ratio:  0.57 Serafina Royals MD Electronically signed by Serafina Royals MD Signature Date/Time: 12/28/2021/9:27:32 AM    Final    CT Renal Stone Study  Result Date: 12/27/2021 CLINICAL DATA:  Flank pain.  Evaluate for kidney stone EXAM: CT ABDOMEN AND PELVIS WITHOUT CONTRAST TECHNIQUE: Multidetector CT imaging of the abdomen and pelvis was performed following the standard protocol without IV contrast. RADIATION DOSE REDUCTION: This exam was performed according to the departmental dose-optimization program which includes automated exposure control, adjustment of the mA and/or kV according to patient size and/or use of iterative reconstruction technique. COMPARISON:  None. FINDINGS: Lower chest: Heart size is enlarged. No pleural effusion or edema. Small pericardial effusion noted. Hepatobiliary: No focal liver abnormality is seen. No gallstones, gallbladder wall thickening, or biliary dilatation. Pancreas: Unremarkable. No pancreatic ductal dilatation or surrounding inflammatory changes. Spleen: Normal in size without focal abnormality. Adrenals/Urinary Tract: Normal adrenal glands. There is a tiny stone within the right UVJ measuring 2-3 mm, image 80/2. This results in mild right hydronephrosis and hydroureter with perinephric fat stranding. Several punctate stones are identified within the upper pole the left kidney. No left-sided hydronephrosis. -Within the upper pole of the right kidney there is a 2.5 cm, 9.55 Hounsfield units, image 36/2. This is compatible with a benign cyst. -Arising off the lateral cortex of the left kidney is a 1.7 cm exophytic lesion measuring 29 Hounsfield units, image 39/2.  This does not meet criteria for a simple cyst. Stomach/Bowel: Stomach is within normal limits. Appendix appears normal. No evidence of bowel wall thickening, distention, or inflammatory changes. Sigmoid diverticulosis without signs of acute diverticulitis. Vascular/Lymphatic: Aortic atherosclerosis. No signs of abdominopelvic adenopathy. Reproductive: Prostate is unremarkable. Other: Trace fluid noted within the dependent portion of the pelvis. Musculoskeletal: Spondylosis identified within the lumbar spine. No acute or suspicious osseous findings. IMPRESSION: 1. Right-sided hydronephrosis and hydroureter secondary to a 2-3 mm right UVJ calculus. 2. Nonobstructing left nephrolithiasis. 3. Indeterminate 1.7 cm exophytic lesion arising off the lateral cortex of the left kidney. This does not meet criteria for a simple cyst. When the patient is clinically stable and able to follow directions and hold their breath (preferably as an outpatient) further evaluation with dedicated abdominal MRI without and with contrast material should be considered. 4. Cardiomegaly with small pericardial effusion. 5. Sigmoid diverticulosis without signs of acute diverticulitis. 6. Aortic Atherosclerosis (ICD10-I70.0). Electronically Signed   By: Kerby Moors M.D.   On: 12/27/2021 06:47     Medications:     amLODipine  10 mg Oral [id number]   carvedilol  25 mg Oral BID   hydrALAZINE  50 mg Oral BID   influenza vac split quadrivalent PF  0.5 mL Intramuscular Tomorrow-1000   nicotine  14 mg Transdermal Daily   pneumococcal 23 valent vaccine  0.5 mL Intramuscular Tomorrow-1000   tamsulosin  0.4 mg Oral QPC supper   acetaminophen **OR** acetaminophen, acetaminophen, hydrALAZINE, HYDROmorphone (DILAUDID) injection, ondansetron **OR** ondansetron (ZOFRAN) IV  Assessment/ Plan:  Ricky Simmons is a 61 y.o.  male with a past medical history of hypertension, nephrolithiasis, and chronic kidney disease stage III. He presents to  the ED with complaints of worsening pain on the right side. He has been admitted for Kidney stone [C58.8] Renal colic on right side [F02] AKI (acute kidney injury) ([place]) [N17.9] Hypertensive emergency [I16.1]   Acute Kidney Injury on chronic kidney disease stage IIIb with baseline creatinine 1.85 and GFR of 40 on .  Acute kidney injury secondary  to hydronephrosis from renal stone Chronic kidney disease is secondary to hypertension CT renal stone shows 2-15mm right sided stone. Urology consulted to evaluate stent placement. Will consider diuresis after procedure. Will continue to monitor  Lab Results  Component Value Date   CREATININE 5.16 (H) 12/28/2021   CREATININE 4.64 (H) 12/27/2021    Intake/Output Summary (Last 24 hours) at 12/28/2021 1155 Last data filed at 12/28/2021 0435 Gross per 24 hour  Intake 427.98 ml  Output 2700 ml  Net -2272.02 ml   2. Hypertension with chronic kidney disease. Home regimen includes amlodipine, carvedilol hydralazine and losartan. Losartan held due to decreased renal function. BP 171/91    LOS: 1 Magnus Crescenzo 1/20/202311:55 AM

## 2021-12-28 NOTE — Consult Note (Signed)
[facility] Consultation Note  Patient ID: DOMENIQUE SOUTHERS, MRN: [id number], DOB/AGE: February 13, 1961 61 y.o. Admit date: 12/27/2021   Date of Consult: 12/28/2021 Primary Physician: Marinda Elk, MD Primary Cardiologist: None  Chief Complaint:  Chief Complaint  Patient presents with   Shortness of Breath   Flank Pain   Reason for Consult:  Abnormal EKG  HPI: 61 y.o. male with known severe hypertension hyperlipidemia tobacco abuse with chronic kidney disease stage IV with a glomerular filtration rate of 24 who has had some chronic shortness of breath with physical activity but is able to walk a reasonable distance on a daily basis.  The patient has had a acute issues with kidney stones and hydronephrosis with pain and was seen in the emergency room yesterday.  At that time a chest x-ray showed cardiomegaly but no evidence of pulmonary edema.  The patient had an EKG showing normal sinus rhythm left atrial enlargement left ventricular hypertrophy and T wave changes consistent with hypertrophic.  Echocardiogram was performed showing mild to moderate global LV systolic dysfunction with ejection fraction of 35% with moderate left ventricular hypertrophy and moderate mitral regurgitation but no evidence of pulmonary hypertension.  The patient has been stable with no evidence of acute coronary syndrome or congestive heart failure symptoms at this time.  Therefore he is at lowest risk possible for cardiovascular complication with urologic procedure.  Blood pressure was quite high when admitted but has improved with better compliance of medication management.  Past Medical History:  Diagnosis Date   Chronic kidney disease    STAGE 3-FOLLOWED BY PCP   GERD (gastroesophageal reflux disease)    RARE   History of kidney stones    H/O   Hypertension    Penile fracture 1989      Surgical History:  Past Surgical History:  Procedure Laterality Date   COLONOSCOPY      HEMORRHOID SURGERY N/A 05/07/2018   Procedure: HEMORRHOIDECTOMY;  Surgeon: Herbert Pun, MD;  Location: [facility] ORS;  Service: General;  Laterality: N/A;   TONSILLECTOMY     AGE 89     Home Meds: Prior to Admission medications   Medication Sig Start Date End Date Taking? Authorizing Provider  acetaminophen (TYLENOL) 500 MG tablet Take 1,000 mg by mouth every 6 (six) hours as needed (for pain/headaches.).   Yes [provider]  amLODipine (NORVASC) 10 MG tablet Take 10 mg by mouth every morning.  03/02/18  Yes [provider]  carvedilol (COREG) 25 MG tablet Take 25 mg by mouth 2 (two) times daily. 03/02/18  Yes [provider]  hydrALAZINE (APRESOLINE) 50 MG tablet Take 50 mg by mouth 2 (two) times daily. 03/03/18  Yes [provider]  losartan (COZAAR) 100 MG tablet Take 100 mg by mouth every morning.  03/02/18  Yes [provider]    Inpatient Medications:   amLODipine  10 mg Oral [id number]   carvedilol  25 mg Oral BID   hydrALAZINE  50 mg Oral BID   influenza vac split quadrivalent PF  0.5 mL Intramuscular Tomorrow-1000   nicotine  14 mg Transdermal Daily   pneumococcal 23 valent vaccine  0.5 mL Intramuscular Tomorrow-1000   tamsulosin  0.4 mg Oral QPC supper     Allergies: No Known Allergies  Social History   Socioeconomic History   Marital status: Married    Spouse name: Not on file   Number of children: Not on file   Years of education: Not on file  Highest education level: Not on file  Occupational History   Not on file  Tobacco Use   Smoking status: Every Day    Types: Cigarettes   Smokeless tobacco: Never   Tobacco comments:    3/4 daily  Vaping Use   Vaping Use: Never used  Substance and Sexual Activity   Alcohol use: Never   Drug use: Never   Sexual activity: Not on file  Other Topics Concern   Not on file  Social History Narrative   Not on file   Social Determinants of Health    Financial Resource Strain: Not on file  Food Insecurity: Not on file  Transportation Needs: Not on file  Physical Activity: Not on file  Stress: Not on file  Social Connections: Not on file  Intimate Partner Violence: Not on file     Family History  Problem Relation Age of Onset   Hypertension Mother      Review of Systems Positive for back and kidney pain Negative for: General:  chills, fever, night sweats or weight changes.  Cardiovascular: PND orthopnea syncope dizziness  Dermatological skin lesions rashes Respiratory: Cough congestion Urologic: Frequent urination urination at night and hematuria Abdominal: negative for nausea, vomiting, diarrhea, bright red blood per rectum, melena, or hematemesis Neurologic: negative for visual changes, and/or hearing changes  All other systems reviewed and are otherwise negative except as noted above.  Labs: No results for input(s): CKTOTAL, CKMB, TROPONINI in the last 72 hours. Lab Results  Component Value Date   WBC 9.5 12/28/2021   HGB 10.9 (L) 12/28/2021   HCT 33.3 (L) 12/28/2021   MCV 92.0 12/28/2021   PLT 177 12/28/2021    Recent Labs  Lab 12/27/21 0404 12/28/21 0440  NA 134* 135  K 4.7 4.8  CL 109 110  CO2 19* 18*  BUN 49* 54*  CREATININE 4.64* 5.16*  CALCIUM 8.6* 8.3*  PROT 6.6  --   BILITOT 0.6  --   ALKPHOS 52  --   ALT 19  --   AST 19  --   GLUCOSE 119* 99   No results found for: CHOL, HDL, LDLCALC, TRIG No results found for: DDIMER  Radiology/Studies:  DG Chest 2 View  Result Date: 12/27/2021 CLINICAL DATA:  61 year old male with shortness of breath. Right flank pain and hematuria. EXAM: CHEST - 2 VIEW COMPARISON:  None. FINDINGS: Cardiomegaly. Mildly tortuous thoracic aorta. Other mediastinal contours are within normal limits. Visualized tracheal air column is within normal limits. Normal lung volumes. No pneumothorax, pulmonary edema, pleural effusion or confluent pulmonary opacity. No acute osseous  abnormality identified. Negative visible bowel gas. IMPRESSION: Cardiomegaly.  No acute cardiopulmonary abnormality. Electronically Signed   By: Genevie Ann M.D.   On: 12/27/2021 04:33   ECHOCARDIOGRAM COMPLETE  Result Date: 12/28/2021    ECHOCARDIOGRAM REPORT   Patient Name:   RUSTY VILLELLA Date of Exam: 12/28/2021 Medical Rec #:  [id number]       Height:       74.5 in Accession #:    [id number]      Weight:       160.0 lb Date of Birth:  1961-11-30       BSA:          1.986 m Patient Age:    41 years        BP:           171/91 mmHg Patient Gender: M  HR:           84 bpm. Exam Location:  [facility] Procedure: 2D Echo, Cardiac Doppler and Color Doppler Indications:     CHF-acute diastolic K93.81  History:         Patient has no prior history of Echocardiogram examinations.                  Risk Factors:Hypertension. CKD.  Sonographer:     Sherrie Sport Referring Phys:  [place] Diagnosing Phys: Serafina Royals MD IMPRESSIONS  1. Left ventricular ejection fraction, by estimation, is 30 to 35%. The left ventricle has moderately decreased function. The left ventricle demonstrates global hypokinesis. The left ventricular internal cavity size was mildly dilated. There is moderate  left ventricular hypertrophy. Left ventricular diastolic parameters are consistent with Grade II diastolic dysfunction (pseudonormalization).  2. Right ventricular systolic function is normal. The right ventricular size is normal.  3. Left atrial size was mild to moderately dilated.  4. The mitral valve is normal in structure. Moderate mitral valve regurgitation.  5. The aortic valve is normal in structure. Aortic valve regurgitation is trivial. FINDINGS  Left Ventricle: Left ventricular ejection fraction, by estimation, is 30 to 35%. The left ventricle has moderately decreased function. The left ventricle demonstrates global hypokinesis. The left ventricular internal cavity size was mildly dilated. There is moderate left  ventricular hypertrophy. Left ventricular diastolic parameters are consistent with Grade II diastolic dysfunction (pseudonormalization). Right Ventricle: The right ventricular size is normal. No increase in right ventricular wall thickness. Right ventricular systolic function is normal. Left Atrium: Left atrial size was mild to moderately dilated. Right Atrium: Right atrial size was normal in size. Pericardium: Trivial pericardial effusion is present. Mitral Valve: The mitral valve is normal in structure. Moderate mitral valve regurgitation. MV peak gradient, 5.3 mmHg. The mean mitral valve gradient is 3.0 mmHg. Tricuspid Valve: The tricuspid valve is normal in structure. Tricuspid valve regurgitation is mild. Aortic Valve: The aortic valve is normal in structure. Aortic valve regurgitation is trivial. Aortic valve mean gradient measures 5.0 mmHg. Aortic valve peak gradient measures 8.8 mmHg. Aortic valve area, by VTI measures 2.27 cm. Pulmonic Valve: The pulmonic valve was normal in structure. Pulmonic valve regurgitation is trivial. Aorta: The aortic root and ascending aorta are structurally normal, with no evidence of dilitation. IAS/Shunts: No atrial level shunt detected by color flow Doppler.  LEFT VENTRICLE PLAX 2D LVIDd:         6.10 cm      Diastology LVIDs:         4.60 cm      LV e' medial:    3.92 cm/s LV PW:         1.40 cm      LV E/e' medial:  17.7 LV IVS:        1.20 cm      LV e' lateral:   4.24 cm/s LVOT diam:     2.10 cm      LV E/e' lateral: 16.4 LV SV:         56 LV SV Index:   28 LVOT Area:     3.46 cm  LV Volumes (MOD) LV vol d, MOD A2C: 163.0 ml LV vol d, MOD A4C: 147.0 ml LV vol s, MOD A2C: 93.7 ml LV vol s, MOD A4C: 76.0 ml LV SV MOD A2C:     69.3 ml LV SV MOD A4C:     147.0 ml LV SV MOD BP:  74.6 ml RIGHT VENTRICLE RV Basal diam:  3.00 cm RV S prime:     17.30 cm/s TAPSE (M-mode): 4.0 cm LEFT ATRIUM              Index        RIGHT ATRIUM           Index LA diam:        4.40 cm  2.22  cm/m   RA Area:     27.70 cm LA Vol (A2C):   121.0 ml 60.93 ml/m  RA Volume:   98.70 ml  49.70 ml/m LA Vol (A4C):   103.0 ml 51.86 ml/m LA Biplane Vol: 114.0 ml 57.40 ml/m  AORTIC VALVE                    PULMONIC VALVE AV Area (Vmax):    2.03 cm     PV Vmax:        0.66 m/s AV Area (Vmean):   2.14 cm     PV Vmean:       39.550 cm/s AV Area (VTI):     2.27 cm     PV VTI:         0.092 m AV Vmax:           148.00 cm/s  PV Peak grad:   1.8 mmHg AV Vmean:          99.400 cm/s  PV Mean grad:   1.0 mmHg AV VTI:            0.249 m      RVOT Peak grad: 3 mmHg AV Peak Grad:      8.8 mmHg AV Mean Grad:      5.0 mmHg LVOT Vmax:         86.80 cm/s LVOT Vmean:        61.300 cm/s LVOT VTI:          0.163 m LVOT/AV VTI ratio: 0.65  AORTA Ao Root diam: 3.60 cm MITRAL VALVE                TRICUSPID VALVE MV Area (PHT): 7.66 cm     TR Peak grad:   17.0 mmHg MV Area VTI:   2.38 cm     TR Vmax:        206.00 cm/s MV Peak grad:  5.3 mmHg MV Mean grad:  3.0 mmHg     SHUNTS MV Vmax:       1.15 m/s     Systemic VTI:  0.16 m MV Vmean:      75.6 cm/s    Systemic Diam: 2.10 cm MV Decel Time: 99 msec      Pulmonic VTI:  0.146 m MV E velocity: 69.40 cm/s MV A velocity: 122.00 cm/s MV E/A ratio:  0.57 Serafina Royals MD Electronically signed by Serafina Royals MD Signature Date/Time: 12/28/2021/9:27:32 AM    Final    CT Renal Stone Study  Result Date: 12/27/2021 CLINICAL DATA:  Flank pain.  Evaluate for kidney stone EXAM: CT ABDOMEN AND PELVIS WITHOUT CONTRAST TECHNIQUE: Multidetector CT imaging of the abdomen and pelvis was performed following the standard protocol without IV contrast. RADIATION DOSE REDUCTION: This exam was performed according to the departmental dose-optimization program which includes automated exposure control, adjustment of the mA and/or kV according to patient size and/or use of iterative reconstruction technique. COMPARISON:  None. FINDINGS: Lower chest: Heart size is enlarged. No pleural effusion or edema.  Small  pericardial effusion noted. Hepatobiliary: No focal liver abnormality is seen. No gallstones, gallbladder wall thickening, or biliary dilatation. Pancreas: Unremarkable. No pancreatic ductal dilatation or surrounding inflammatory changes. Spleen: Normal in size without focal abnormality. Adrenals/Urinary Tract: Normal adrenal glands. There is a tiny stone within the right UVJ measuring 2-3 mm, image 80/2. This results in mild right hydronephrosis and hydroureter with perinephric fat stranding. Several punctate stones are identified within the upper pole the left kidney. No left-sided hydronephrosis. -Within the upper pole of the right kidney there is a 2.5 cm, 9.55 Hounsfield units, image 36/2. This is compatible with a benign cyst. -Arising off the lateral cortex of the left kidney is a 1.7 cm exophytic lesion measuring 29 Hounsfield units, image 39/2. This does not meet criteria for a simple cyst. Stomach/Bowel: Stomach is within normal limits. Appendix appears normal. No evidence of bowel wall thickening, distention, or inflammatory changes. Sigmoid diverticulosis without signs of acute diverticulitis. Vascular/Lymphatic: Aortic atherosclerosis. No signs of abdominopelvic adenopathy. Reproductive: Prostate is unremarkable. Other: Trace fluid noted within the dependent portion of the pelvis. Musculoskeletal: Spondylosis identified within the lumbar spine. No acute or suspicious osseous findings. IMPRESSION: 1. Right-sided hydronephrosis and hydroureter secondary to a 2-3 mm right UVJ calculus. 2. Nonobstructing left nephrolithiasis. 3. Indeterminate 1.7 cm exophytic lesion arising off the lateral cortex of the left kidney. This does not meet criteria for a simple cyst. When the patient is clinically stable and able to follow directions and hold their breath (preferably as an outpatient) further evaluation with dedicated abdominal MRI without and with contrast material should be considered. 4. Cardiomegaly  with small pericardial effusion. 5. Sigmoid diverticulosis without signs of acute diverticulitis. 6. Aortic Atherosclerosis (ICD10-I70.0). Electronically Signed   By: Kerby Moors M.D.   On: 12/27/2021 06:47    EKG: Normal sinus rhythm left atrial Arta left ventricular hypertrophy with T wave changes  Weights: Filed Weights   12/27/21 0351  Weight: 72.6 kg     Physical Exam: Blood pressure (!) 171/91, pulse 84, temperature 98.2 F (36.8 C), resp. rate 16, height 6' 2.5" (1.892 m), weight 72.6 kg, SpO2 95 %. Body mass index is 20.27 kg/m. General: Well developed, well nourished, in no acute distress. Head eyes ears nose throat: Normocephalic, atraumatic, sclera non-icteric, no xanthomas, nares are without discharge. No apparent thyromegaly and/or mass  Lungs: Normal respiratory effort.  no wheezes, no rales, no rhonchi.  Heart: RRR with normal S1 S2. no murmur slight gallop, no rub, PMI is normal size and placement, carotid upstroke normal without bruit, jugular venous pressure is normal Abdomen: Soft, non-tender, non-distended with normoactive bowel sounds. No hepatomegaly. No rebound/guarding. No obvious abdominal masses. Abdominal aorta is normal size without bruit Extremities: No edema. no cyanosis, no clubbing, no ulcers  Peripheral : 2+ bilateral upper extremity pulses, 2+ bilateral femoral pulses, 2+ bilateral dorsal pedal pulse Neuro: Alert and oriented. No facial asymmetry. No focal deficit. Moves all extremities spontaneously. Musculoskeletal: Normal muscle tone without kyphosis Psych:  Responds to questions appropriately with a normal affect.    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  Continue aggressive medication management for hypertension control including maximize dosages of amlodipine carvedilol and  hydralazine for goal systolic blood pressure below 160 mm but primarily will change medication management for further improvements 2.  Avoid angiotensin receptor blocker ACE inhibitor at this time due to concerns for acute on chronic kidney disease 3.  Proceed to urologic procedure with anesthesia watching closely for fluid balances hypoxia pulmonary edema and/or malignant hypertension with treatment thereof is necessary 4.  No further cardiac diagnostics necessary at this time due to no evidence of cardiovascular symptoms but will have further outpatient address    Signed, Corey Skains M.D. [facility] 12/28/2021, 12:17 PM

## 2021-12-28 NOTE — Interval H&P Note (Signed)
UROLOGY H&P UPDATE  Agree with prior H&P dated 12/27/2021 by Dr. Erlene Quan.  61 year old comorbid male with poorly controlled hypertension and elevated BNP who presented with a 3 mm distal ureteral stone and acute kidney injury.  He has been cleared by cardiology for right ureteroscopy and stone removal versus stent placement.  Cardiac: RRR Lungs: CTA bilaterally  Laterality: Right Procedure: Right ureteroscopy, laser lithotripsy, stent placement  Urine: Urine culture 1/19 no growth  We specifically discussed the risks ureteroscopy including bleeding, infection/sepsis, stent related symptoms including flank pain/urgency/frequency/incontinence/dysuria, ureteral injury, inability to access stone, or need for staged or additional procedures.   Ricky Co, MD 12/28/2021

## 2021-12-28 NOTE — Transfer of Care (Signed)
Immediate Anesthesia Transfer of Care Note  Patient: Ricky Simmons  Procedure(s) Performed: Procedure(s): CYSTOSCOPY/URETEROSCOPY/STENT PLACEMENT (Right)  Patient Location: PACU  Anesthesia Type:General  Level of Consciousness: sedated  Airway & Oxygen Therapy: Patient Spontanous Breathing and Patient connected to face mask oxygen  Post-op Assessment: Report given to RN and Post -op Vital signs reviewed and stable  Post vital signs: Reviewed and stable  Last Vitals:  Vitals:   12/28/21 1416 12/28/21 1551  BP: (!) 145/92 97/70  Pulse: 85 71  Resp: 14 14  Temp: 36.9 C   SpO2: 76% 701%    Complications: No apparent anesthesia complications

## 2021-12-28 NOTE — Op Note (Signed)
Date of procedure: 12/28/21  Preoperative diagnosis:  Right distal ureteral stone Acute kidney injury  Postoperative diagnosis:  Same  Procedure: Cystoscopy, right ureteroscopy, basket extraction of distal ureteral stone, right ureteral stent placement  Surgeon: Nickolas Madrid, MD  Anesthesia: General  Complications: None  Intraoperative findings:  Small prostate, normal cystoscopy, right distal ureteral stone crowning at UVJ Right distal ureteral stone basket extracted, uncomplicated stent placement  EBL: None  Specimens: None  Drains: Right 6 French by 28 cm ureteral stent  Indication: Ricky Simmons is a 61 y.o. patient with 3 mm right distal ureteral stone and AKI who opted for ureteroscopy and stone removal.  After reviewing the management options for treatment, they elected to proceed with the above surgical procedure(s). We have discussed the potential benefits and risks of the procedure, side effects of the proposed treatment, the likelihood of the patient achieving the goals of the procedure, and any potential problems that might occur during the procedure or recuperation. Informed consent has been obtained.  Description of procedure:  The patient was taken to the operating room and general anesthesia with propofol was induced. SCDs were placed for DVT prophylaxis. The patient was placed in the dorsal lithotomy position, prepped and draped in the usual sterile fashion, and preoperative antibiotics(Ancef) were administered. A preoperative time-out was performed.   A 21 French rigid cystoscope was used to intubate the urethra and a normal-appearing urethra was followed proximally to the bladder.  The prostate was small.  There were mild bladder trabeculations but no suspicious lesions.  Ureteral orifices were orthotopic bilaterally, and I could clearly see the right distal ureteral stone crowning at the UVJ.  With the aid of an access catheter I was able to navigate a  sensor wire alongside the stone up to the kidney under fluoroscopic vision.  The semirigid ureteroscope was then advanced into the ureter, and the stone basket extracted.  The rigid cystoscope was then backloaded over the wire, and a 6 [place] by 28 cm ureteral stent was uneventfully placed with a curl over the projected location of the kidney, as well as in the bladder.  Urine drained through the side ports of the stent.  The bladder was drained, and this concluded our procedure.  Disposition: Stable to PACU  Plan: Schedule stent removal in clinic in 1 to 2 weeks  Nickolas Madrid, MD

## 2021-12-28 NOTE — Anesthesia Preprocedure Evaluation (Addendum)
Anesthesia Evaluation  Patient identified by MRN, date of birth, ID band Patient awake    Reviewed: Allergy & Precautions, NPO status , Patient's Chart, lab work & pertinent test results  History of Anesthesia Complications Negative for: history of anesthetic complications  Airway Mallampati: III   Neck ROM: Full    Dental   Missing molars x3:   Pulmonary Current Smoker (3/4 ppd) and Patient abstained from smoking.,    Pulmonary exam normal breath sounds clear to auscultation       Cardiovascular hypertension, Normal cardiovascular exam Rhythm:Regular Rate:Normal  Hypertensive urgency this admission  ECG 12/27/21:  Normal sinus rhythm Possible Left atrial enlargement Left axis deviation Left ventricular hypertrophy ( Sokolow-Lyon , Cornell product , Romhilt-Estes ) Cannot rule out Septal infarct , age undetermined ST & T wave abnormality, consider lateral ischemia  Echo 12/28/21:  1. Left ventricular ejection fraction, by estimation, is 30 to 35%. The left ventricle has moderately decreased function. The left ventricle demonstrates global hypokinesis. The left ventricular internal cavity size was mildly dilated. There is moderate left ventricular hypertrophy. Left ventricular diastolic parameters are consistent with Grade II diastolic dysfunction (pseudonormalization).  2. Right ventricular systolic function is normal. The right ventricular size is normal.  3. Left atrial size was mild to moderately dilated.  4. The mitral valve is normal in structure. Moderate mitral valve regurgitation.  5. The aortic valve is normal in structure. Aortic valve regurgitation is trivial.   Neuro/Psych negative neurological ROS     GI/Hepatic negative GI ROS,   Endo/Other  negative endocrine ROS  Renal/GU Renal disease (stage III CKD; nephrolithiasis)     Musculoskeletal   Abdominal   Peds  Hematology negative hematology  ROS (+)   Anesthesia Other Findings   Reproductive/Obstetrics                            Anesthesia Physical Anesthesia Plan  ASA: 4 and emergent  Anesthesia Plan: General   Post-op Pain Management:    Induction: Intravenous  PONV Risk Score and Plan: 1 and Propofol infusion, TIVA and Treatment may vary due to age or medical condition  Airway Management Planned: Natural Airway  Additional Equipment:   Intra-op Plan:   Post-operative Plan:   Informed Consent: I have reviewed the patients History and Physical, chart, labs and discussed the procedure including the risks, benefits and alternatives for the proposed anesthesia with the patient or authorized representative who has indicated his/her understanding and acceptance.       Plan Discussed with: CRNA  Anesthesia Plan Comments: (LMA/GETA backup discussed.  Patient consented for risks of anesthesia including but not limited to:  - adverse reactions to medications - damage to eyes, teeth, lips or other oral mucosa - nerve damage due to positioning  - sore throat or hoarseness - damage to heart, brain, nerves, lungs, other parts of body or loss of life  Informed patient about role of CRNA in peri- and intra-operative care.  Patient voiced understanding.)        Anesthesia Quick Evaluation

## 2021-12-28 NOTE — Anesthesia Procedure Notes (Signed)
Date/Time: 12/28/2021 3:20 PM Performed by: Doreen Salvage, CRNA Pre-anesthesia Checklist: Patient identified, Emergency Drugs available, Suction available and Patient being monitored Patient Re-evaluated:Patient Re-evaluated prior to induction Oxygen Delivery Method: Simple face mask Induction Type: IV induction Dental Injury: Teeth and Oropharynx as per pre-operative assessment

## 2021-12-28 NOTE — Progress Notes (Signed)
Urology Consult Follow Up  Subjective: Patient still with right renal colic and has not passed a stone fragment.  Is afebrile but still hypertensive.  Cardiac clearance pending.  His serum creatinine has increased from 4.64 yesterday to 5.16 this morning.  His WBC count is up from 9.5 yesterday to 12.6 today.  He reports good urine output without dysuria or gross hematuria.   Anti-infectives: Anti-infectives (From admission, onward)    None       Current Facility-Administered Medications  Medication Dose Route Frequency Provider Last Rate Last Admin   acetaminophen (TYLENOL) tablet 650 mg  650 mg Oral Q6H PRN Agbata, Tochukwu, MD       Or   acetaminophen (TYLENOL) suppository 650 mg  650 mg Rectal Q6H PRN Agbata, Tochukwu, MD       acetaminophen (TYLENOL) tablet 1,000 mg  1,000 mg Oral Q6H PRN Agbata, Tochukwu, MD       amLODipine (NORVASC) tablet 10 mg  10 mg Oral Butch Penny, Tochukwu, MD   10 mg at 12/27/21 0929   carvedilol (COREG) tablet 25 mg  25 mg Oral BID Agbata, Tochukwu, MD   25 mg at 12/27/21 2136   hydrALAZINE (APRESOLINE) injection 10 mg  10 mg Intravenous Q6H PRN Agbata, Tochukwu, MD   10 mg at 12/28/21 0422   hydrALAZINE (APRESOLINE) tablet 50 mg  50 mg Oral BID Agbata, Tochukwu, MD   50 mg at 12/27/21 2136   HYDROmorphone (DILAUDID) injection 1 mg  1 mg Intravenous Q4H PRN Agbata, Tochukwu, MD   1 mg at 12/28/21 0422   influenza vac split quadrivalent PF (FLUARIX) injection 0.5 mL  0.5 mL Intramuscular Tomorrow-1000 Agbata, Tochukwu, MD       nicotine (NICODERM CQ - dosed in mg/24 hours) patch 14 mg  14 mg Transdermal Daily Agbata, Tochukwu, MD       ondansetron (ZOFRAN) tablet 4 mg  4 mg Oral Q6H PRN Agbata, Tochukwu, MD       Or   ondansetron (ZOFRAN) injection 4 mg  4 mg Intravenous Q6H PRN Agbata, Tochukwu, MD       pneumococcal 23 valent vaccine (PNEUMOVAX-23) injection 0.5 mL  0.5 mL Intramuscular Tomorrow-1000 Agbata, Tochukwu, MD        tamsulosin (FLOMAX) capsule 0.4 mg  0.4 mg Oral QPC supper Agbata, Tochukwu, MD   0.4 mg at 12/27/21 1749     Objective: Vital signs in last 24 hours: Temp:  [98.1 F (36.7 C)-98.6 F (37 C)] 98.2 F (36.8 C) (01/20 0752) Pulse Rate:  [79-88] 84 (01/20 0752) Resp:  [12-16] 16 (01/20 0752) BP: (124-174)/(76-116) 171/91 (01/20 0752) SpO2:  [95 %-99 %] 95 % (01/20 0752)  Intake/Output from previous day: 01/19 0701 - 01/20 0700 In: 428 [I.V.:428] Out: 2700 [Urine:2700] Intake/Output this shift: No intake/output data recorded.   Physical Exam Constitutional:  Well nourished. Alert and oriented, No acute distress. HEENT:  AT, moist mucus membranes.  Trachea midline Cardiovascular: No clubbing, cyanosis, or edema. Respiratory: Normal respiratory effort, no increased work of breathing. GU: Right CVA tenderness.  No bladder fullness or masses.   Neurologic: Grossly intact, no focal deficits, moving all 4 extremities. Psychiatric: Normal mood and affect.   Lab Results:  Recent Labs    12/27/21 0404 12/28/21 0440  WBC 12.6* 9.5  HGB 11.5* 10.9*  HCT 35.2* 33.3*  PLT 216 177   BMET Recent Labs    12/27/21 0404 12/28/21 0440  NA 134* 135  K 4.7 4.8  CL  109 110  CO2 19* 18*  GLUCOSE 119* 99  BUN 49* 54*  CREATININE 4.64* 5.16*  CALCIUM 8.6* 8.3*   PT/INR No results for input(s): LABPROT, INR in the last 72 hours. ABG No results for input(s): PHART, HCO3 in the last 72 hours.  Invalid input(s): PCO2, PO2  Studies/Results: DG Chest 2 View  Result Date: 12/27/2021 CLINICAL DATA:  61 year old male with shortness of breath. Right flank pain and hematuria. EXAM: CHEST - 2 VIEW COMPARISON:  None. FINDINGS: Cardiomegaly. Mildly tortuous thoracic aorta. Other mediastinal contours are within normal limits. Visualized tracheal air column is within normal limits. Normal lung volumes. No pneumothorax, pulmonary edema, pleural effusion or confluent pulmonary opacity. No acute  osseous abnormality identified. Negative visible bowel gas. IMPRESSION: Cardiomegaly.  No acute cardiopulmonary abnormality. Electronically Signed   By: Genevie Ann M.D.   On: 12/27/2021 04:33   ECHOCARDIOGRAM COMPLETE  Result Date: 12/28/2021    ECHOCARDIOGRAM REPORT   Patient Name:   Ricky Simmons Date of Exam: 12/28/2021 Medical Rec #:  [id number]       Height:       74.5 in Accession #:    [id number]      Weight:       160.0 lb Date of Birth:  [birth year]       BSA:          1.986 m Patient Age:    61 years        BP:           171/91 mmHg Patient Gender: M               HR:           84 bpm. Exam Location:  [facility] Procedure: 2D Echo, Cardiac Doppler and Color Doppler Indications:     CHF-acute diastolic O27.03  History:         Patient has no prior history of Echocardiogram examinations.                  Risk Factors:Hypertension. CKD.  Sonographer:     Sherrie Sport Referring Phys:  [place] Diagnosing Phys: Serafina Royals MD IMPRESSIONS  1. Left ventricular ejection fraction, by estimation, is 30 to 35%. The left ventricle has moderately decreased function. The left ventricle demonstrates global hypokinesis. The left ventricular internal cavity size was mildly dilated. There is moderate  left ventricular hypertrophy. Left ventricular diastolic parameters are consistent with Grade II diastolic dysfunction (pseudonormalization).  2. Right ventricular systolic function is normal. The right ventricular size is normal.  3. Left atrial size was mild to moderately dilated.  4. The mitral valve is normal in structure. Moderate mitral valve regurgitation.  5. The aortic valve is normal in structure. Aortic valve regurgitation is trivial. FINDINGS  Left Ventricle: Left ventricular ejection fraction, by estimation, is 30 to 35%. The left ventricle has moderately decreased function. The left ventricle demonstrates global hypokinesis. The left ventricular internal cavity size was mildly dilated. There is moderate  left ventricular hypertrophy. Left ventricular diastolic parameters are consistent with Grade II diastolic dysfunction (pseudonormalization). Right Ventricle: The right ventricular size is normal. No increase in right ventricular wall thickness. Right ventricular systolic function is normal. Left Atrium: Left atrial size was mild to moderately dilated. Right Atrium: Right atrial size was normal in size. Pericardium: Trivial pericardial effusion is present. Mitral Valve: The mitral valve is normal in structure. Moderate mitral valve regurgitation. MV peak gradient, 5.3 mmHg. The mean mitral  valve gradient is 3.0 mmHg. Tricuspid Valve: The tricuspid valve is normal in structure. Tricuspid valve regurgitation is mild. Aortic Valve: The aortic valve is normal in structure. Aortic valve regurgitation is trivial. Aortic valve mean gradient measures 5.0 mmHg. Aortic valve peak gradient measures 8.8 mmHg. Aortic valve area, by VTI measures 2.27 cm. Pulmonic Valve: The pulmonic valve was normal in structure. Pulmonic valve regurgitation is trivial. Aorta: The aortic root and ascending aorta are structurally normal, with no evidence of dilitation. IAS/Shunts: No atrial level shunt detected by color flow Doppler.  LEFT VENTRICLE PLAX 2D LVIDd:         6.10 cm      Diastology LVIDs:         4.60 cm      LV e' medial:    3.92 cm/s LV PW:         1.40 cm      LV E/e' medial:  17.7 LV IVS:        1.20 cm      LV e' lateral:   4.24 cm/s LVOT diam:     2.10 cm      LV E/e' lateral: 16.4 LV SV:         56 LV SV Index:   28 LVOT Area:     3.46 cm  LV Volumes (MOD) LV vol d, MOD A2C: 163.0 ml LV vol d, MOD A4C: 147.0 ml LV vol s, MOD A2C: 93.7 ml LV vol s, MOD A4C: 76.0 ml LV SV MOD A2C:     69.3 ml LV SV MOD A4C:     147.0 ml LV SV MOD BP:      74.6 ml RIGHT VENTRICLE RV Basal diam:  3.00 cm RV S prime:     17.30 cm/s TAPSE (M-mode): 4.0 cm LEFT ATRIUM              Index        RIGHT ATRIUM           Index LA diam:        4.40 cm   2.22 cm/m   RA Area:     27.70 cm LA Vol (A2C):   121.0 ml 60.93 ml/m  RA Volume:   98.70 ml  49.70 ml/m LA Vol (A4C):   103.0 ml 51.86 ml/m LA Biplane Vol: 114.0 ml 57.40 ml/m  AORTIC VALVE                    PULMONIC VALVE AV Area (Vmax):    2.03 cm     PV Vmax:        0.66 m/s AV Area (Vmean):   2.14 cm     PV Vmean:       39.550 cm/s AV Area (VTI):     2.27 cm     PV VTI:         0.092 m AV Vmax:           148.00 cm/s  PV Peak grad:   1.8 mmHg AV Vmean:          99.400 cm/s  PV Mean grad:   1.0 mmHg AV VTI:            0.249 m      RVOT Peak grad: 3 mmHg AV Peak Grad:      8.8 mmHg AV Mean Grad:      5.0 mmHg LVOT Vmax:         86.80 cm/s  LVOT Vmean:        61.300 cm/s LVOT VTI:          0.163 m LVOT/AV VTI ratio: 0.65  AORTA Ao Root diam: 3.60 cm MITRAL VALVE                TRICUSPID VALVE MV Area (PHT): 7.66 cm     TR Peak grad:   17.0 mmHg MV Area VTI:   2.38 cm     TR Vmax:        206.00 cm/s MV Peak grad:  5.3 mmHg MV Mean grad:  3.0 mmHg     SHUNTS MV Vmax:       1.15 m/s     Systemic VTI:  0.16 m MV Vmean:      75.6 cm/s    Systemic Diam: 2.10 cm MV Decel Time: 99 msec      Pulmonic VTI:  0.146 m MV E velocity: 69.40 cm/s MV A velocity: 122.00 cm/s MV E/A ratio:  0.57 Serafina Royals MD Electronically signed by Serafina Royals MD Signature Date/Time: 12/28/2021/9:27:32 AM    Final    CT Renal Stone Study  Result Date: 12/27/2021 CLINICAL DATA:  Flank pain.  Evaluate for kidney stone EXAM: CT ABDOMEN AND PELVIS WITHOUT CONTRAST TECHNIQUE: Multidetector CT imaging of the abdomen and pelvis was performed following the standard protocol without IV contrast. RADIATION DOSE REDUCTION: This exam was performed according to the departmental dose-optimization program which includes automated exposure control, adjustment of the mA and/or kV according to patient size and/or use of iterative reconstruction technique. COMPARISON:  None. FINDINGS: Lower chest: Heart size is enlarged. No pleural effusion or  edema. Small pericardial effusion noted. Hepatobiliary: No focal liver abnormality is seen. No gallstones, gallbladder wall thickening, or biliary dilatation. Pancreas: Unremarkable. No pancreatic ductal dilatation or surrounding inflammatory changes. Spleen: Normal in size without focal abnormality. Adrenals/Urinary Tract: Normal adrenal glands. There is a tiny stone within the right UVJ measuring 2-3 mm, image 80/2. This results in mild right hydronephrosis and hydroureter with perinephric fat stranding. Several punctate stones are identified within the upper pole the left kidney. No left-sided hydronephrosis. -Within the upper pole of the right kidney there is a 2.5 cm, 9.55 Hounsfield units, image 36/2. This is compatible with a benign cyst. -Arising off the lateral cortex of the left kidney is a 1.7 cm exophytic lesion measuring 29 Hounsfield units, image 39/2. This does not meet criteria for a simple cyst. Stomach/Bowel: Stomach is within normal limits. Appendix appears normal. No evidence of bowel wall thickening, distention, or inflammatory changes. Sigmoid diverticulosis without signs of acute diverticulitis. Vascular/Lymphatic: Aortic atherosclerosis. No signs of abdominopelvic adenopathy. Reproductive: Prostate is unremarkable. Other: Trace fluid noted within the dependent portion of the pelvis. Musculoskeletal: Spondylosis identified within the lumbar spine. No acute or suspicious osseous findings. IMPRESSION: 1. Right-sided hydronephrosis and hydroureter secondary to a 2-3 mm right UVJ calculus. 2. Nonobstructing left nephrolithiasis. 3. Indeterminate 1.7 cm exophytic lesion arising off the lateral cortex of the left kidney. This does not meet criteria for a simple cyst. When the patient is clinically stable and able to follow directions and hold their breath (preferably as an outpatient) further evaluation with dedicated abdominal MRI without and with contrast material should be considered. 4.  Cardiomegaly with small pericardial effusion. 5. Sigmoid diverticulosis without signs of acute diverticulitis. 6. Aortic Atherosclerosis (ICD10-I70.0). Electronically Signed   By: Kerby Moors M.D.   On: 12/27/2021 06:47     Assessment and  Plan: 61 year old male who presented to the ED with right-sided flank pain secondary to a 2 to 3 mm right distal UVJ stone in the setting of acute on chronic renal insufficiency with poorly controlled hypertension.  Patient continues to have right-sided flank pain and increasing in serum creatinine without passage of ureteral stone and will need ureteral stent placement once cleared through cardiology.  I explained to the patient how ureteral stent is placed and also the feeling of "stent discomfort."  He is agreeable and wishes to proceed if cleared through cardiology.  I spoke with Dr. Sharyne Peach regarding his cardiac status.  It is felt that his cardiac issues, while severe, are chronic and longstanding and will need further work out at an outpatient basis, but he is stable enough to proceed with a ureteral stent placement with the use of propofol.    I have notified the patient regarding our plans to proceed with ureteral stent placement this afternoon and advised him to remain NPO       LOS: 1 day    [facility] 12/28/2021

## 2021-12-28 NOTE — TOC Initial Note (Signed)
Transition of Care [facility]) - Initial/Assessment Note    Patient Details  Name: Ricky Simmons MRN: [id number] Date of Birth: [birth year]  Transition of Care [facility]) CM/SW Contact:    Beverly Sessions, RN Phone Number: 12/28/2021, 10:14 AM  Clinical Narrative:                  Transition of Care [facility]) Screening Note   Patient Details  Name: Ricky Simmons Date of Birth: [birth year]   Transition of Care [facility]) CM/SW Contact:    Beverly Sessions, RN Phone Number: 12/28/2021, 10:14 AM    Transition of Care Department [facility]) has reviewed patient and no TOC needs have been identified at this time. We will continue to monitor patient advancement through interdisciplinary progression rounds. If new patient transition needs arise, please place a TOC consult.          Patient Goals and CMS Choice        Expected Discharge Plan and Services                                                Prior Living Arrangements/Services                       Activities of Daily Living Home Assistive Devices/Equipment: None ADL Screening (condition at time of admission) Patient's cognitive ability adequate to safely complete daily activities?: No Is the patient deaf or have difficulty hearing?: No Does the patient have difficulty seeing, even when wearing glasses/contacts?: No Does the patient have difficulty concentrating, remembering, or making decisions?: No Patient able to express need for assistance with ADLs?: Yes Does the patient have difficulty dressing or bathing?: No Independently performs ADLs?: Yes (appropriate for developmental age) Does the patient have difficulty walking or climbing stairs?: No Weakness of Legs: None Weakness of Arms/Hands: None  Permission Sought/Granted                  Emotional Assessment              Admission diagnosis:  Kidney stone [W09.8] Renal colic on right side [J19] AKI (acute kidney injury) ([place])  [N17.9] Hypertensive emergency [I16.1] Patient Active Problem List   Diagnosis Date Noted   Renal colic on right side 14/78/2956   Acute unilateral obstructive uropathy 12/27/2021   Hypertensive urgency 12/27/2021   Chronic kidney disease    AKI (acute kidney injury) ([place])    Nicotine dependence    Non compliance w medication regimen    HYPERTENSION 10/31/2008   HYPERTENSION, CONTROLLED 10/28/2008   PCP:  Marinda Elk, MD Pharmacy:   [facility] [address], [place] - [place] [address] [place] [address] Phone: [phone] Fax: [phone]     Social Determinants of Health (SDOH) Interventions    Readmission Risk Interventions No flowsheet data found.

## 2021-12-28 NOTE — Progress Notes (Signed)
*  PRELIMINARY RESULTS* Echocardiogram 2D Echocardiogram has been performed.  Sherrie Sport 12/28/2021, 8:43 AM

## 2021-12-28 NOTE — Progress Notes (Signed)
Patient ID: Ricky Simmons, male   DOB: November 09, 1961, 61 y.o.   MRN: [id number] [facility] PROGRESS NOTE  Ricky Simmons IOX:[id number] DOB: 10-Mar-1961 DOA: 12/27/2021 PCP: Marinda Elk, MD  HPI/Subjective: Patient states that he has been having a cough and felt like he has had a cold for 3 weeks.  He cannot lie flat.  He has been having weight loss.  Came in with abdominal pain and found to have a small kidney stone with hydronephrosis.  Creatinine worsening today.  Objective: Vitals:   12/28/21 0417 12/28/21 0752  BP: (!) 174/116 (!) 171/91  Pulse: 88 84  Resp: 16 16  Temp: 98.2 F (36.8 C) 98.2 F (36.8 C)  SpO2: 99% 95%    Intake/Output Summary (Last 24 hours) at 12/28/2021 1333 Last data filed at 12/28/2021 0435 Gross per 24 hour  Intake 427.98 ml  Output 2500 ml  Net -2072.02 ml   Filed Weights   12/27/21 0351  Weight: 72.6 kg    ROS: Review of Systems  Respiratory:  Positive for cough and shortness of breath.   Cardiovascular:  Negative for chest pain.  Gastrointestinal:  Negative for abdominal pain, nausea and vomiting.  Exam: Physical Exam HENT:     Head: Normocephalic.     Mouth/Throat:     Pharynx: No oropharyngeal exudate.  Eyes:     General: Lids are normal.     Conjunctiva/sclera: Conjunctivae normal.  Cardiovascular:     Rate and Rhythm: Normal rate and regular rhythm.     Heart sounds: Normal heart sounds, S1 normal and S2 normal.  Pulmonary:     Breath sounds: Examination of the right-lower field reveals decreased breath sounds and rales. Examination of the left-lower field reveals decreased breath sounds and rales. Decreased breath sounds and rales present. No wheezing or rhonchi.  Abdominal:     Palpations: Abdomen is soft.     Tenderness: There is no abdominal tenderness.  Musculoskeletal:     Right lower leg: No swelling.     Left lower leg: No swelling.  Skin:    General: Skin is warm.     Findings: No rash.  Neurological:      Mental Status: He is alert and oriented to person, place, and time.      Scheduled Meds:  amLODipine  10 mg Oral [id number]   carvedilol  25 mg Oral BID   hydrALAZINE  50 mg Oral BID   influenza vac split quadrivalent PF  0.5 mL Intramuscular Tomorrow-1000   nicotine  14 mg Transdermal Daily   pneumococcal 23 valent vaccine  0.5 mL Intramuscular Tomorrow-1000   tamsulosin  0.4 mg Oral QPC supper    Assessment/Plan:  Acute kidney injury on chronic kidney disease stage IIIb with baseline creatinine around 1.85.  Patient with worsening creatinine up to 5.16 today likely secondary to kidney stone. Right distal ureteral calculus with right-sided hydronephrosis and hydroureter.  Urology to do a procedure today.  Continue Flomax Impending CHF (acute on chronic systolic) and cardiomyopathy with an EF of 30 to 35%, elevated BNP.  Will likely need diuresis after procedure. Accelerated hypertension on Coreg, amlodipine and hydralazine.  Last blood pressure 171/91. 1.7 cm indeterminate lesion of the left kidney.  Will need follow-up with MRI as outpatient.  Neurology already following.      Code Status:     Code Status Orders  (From admission, onward)           Start  Ordered   12/27/21 0832  Full code  Continuous        12/27/21 0833           Code Status History     This patient has a current code status but no historical code status.      Family Communication: Updated patient's wife on the phone Disposition Plan: Status is: Inpatient  Case discussed with cardiology, nephrology and urology.  Case discussed with nursing staff  Consultants: Cardiology Nephrology Urology  [place]  [facility]

## 2021-12-29 DIAGNOSIS — E875 Hyperkalemia: Secondary | ICD-10-CM

## 2021-12-29 DIAGNOSIS — I5022 Chronic systolic (congestive) heart failure: Secondary | ICD-10-CM

## 2021-12-29 LAB — BASIC METABOLIC PANEL
Anion gap: 7 (ref 5–15)
BUN: 62 mg/dL — ABNORMAL HIGH (ref 6–20)
CO2: 18 mmol/L — ABNORMAL LOW (ref 22–32)
Calcium: 8.1 mg/dL — ABNORMAL LOW (ref 8.9–10.3)
Chloride: 105 mmol/L (ref 98–111)
Creatinine, Ser: 5.49 mg/dL — ABNORMAL HIGH (ref 0.61–1.24)
GFR, Estimated: 11 mL/min — ABNORMAL LOW (ref >60)
Glucose, Bld: 213 mg/dL — ABNORMAL HIGH (ref 70–99)
Potassium: 5.3 mmol/L — ABNORMAL HIGH (ref 3.5–5.1)
Sodium: 130 mmol/L — ABNORMAL LOW (ref 135–145)

## 2021-12-29 LAB — CBC
HCT: 32.4 % — ABNORMAL LOW (ref 39.0–52.0)
Hemoglobin: 10.6 g/dL — ABNORMAL LOW (ref 13.0–17.0)
MCH: 30.1 pg (ref 26.0–34.0)
MCHC: 32.7 g/dL (ref 30.0–36.0)
MCV: 92 fL (ref 80.0–100.0)
Platelets: 185 10*3/uL (ref 150–400)
RBC: 3.52 MIL/uL — ABNORMAL LOW (ref 4.22–5.81)
RDW: 14.4 % (ref 11.5–15.5)
WBC: 6.8 10*3/uL (ref 4.0–10.5)
nRBC: 0 % (ref 0.0–0.2)

## 2021-12-29 MED ORDER — HYDRALAZINE HCL 50 MG PO TABS
100.0000 mg | ORAL_TABLET | Freq: Three times a day (TID) | ORAL | Status: DC
  Administered 2021-12-29 – 2022-01-02 (×13): 100 mg via ORAL
  Filled 2021-12-29 (×13): qty 2

## 2021-12-29 MED ORDER — SODIUM ZIRCONIUM CYCLOSILICATE 10 G PO PACK
10.0000 g | PACK | Freq: Two times a day (BID) | ORAL | Status: DC
  Administered 2021-12-29 – 2021-12-30 (×3): 10 g via ORAL
  Filled 2021-12-29 (×4): qty 1

## 2021-12-29 MED ORDER — CLONIDINE HCL 0.1 MG PO TABS: 0.1000 mg | ORAL_TABLET | Freq: Three times a day (TID) | ORAL | Status: DC | PRN

## 2021-12-29 MED ORDER — OXYBUTYNIN CHLORIDE 5 MG PO TABS
5.0000 mg | ORAL_TABLET | Freq: Three times a day (TID) | ORAL | Status: DC | PRN
  Administered 2022-01-01: 14:00:00 5 mg via ORAL
  Filled 2021-12-29: qty 1

## 2021-12-29 MED ORDER — OXYCODONE HCL 5 MG PO TABS
5.0000 mg | ORAL_TABLET | Freq: Four times a day (QID) | ORAL | Status: DC | PRN
  Administered 2021-12-30 – 2022-01-01 (×5): 5 mg via ORAL
  Filled 2021-12-29 (×5): qty 1

## 2021-12-29 NOTE — Progress Notes (Signed)
[facility] Encounter Note  Patient: Ricky Simmons / Admit Date: 12/27/2021 / Date of Encounter: 12/29/2021, 7:51 AM   Subjective: Overall procedure went well with no evidence of complication.  Patient with less kidney pain.  Patient still has some significant hypertension although has had some stability.  Echocardiogram showing mild to moderate LV systolic dysfunction with severe left ventricular hypertrophy likely secondary to significant hypertension over the years.  Patient still has a significant chronic kidney disease stage IV which complicates the concerns for treatment with hypertension.  Glomerular filtration rate appears to be significantly abnormal  Review of Systems: Positive for: Mild pain Negative for: Vision change, hearing change, syncope, dizziness, nausea, vomiting,diarrhea, bloody stool, stomach pain, cough, congestion, diaphoresis, urinary frequency, urinary pain,skin lesions, skin rashes Others previously listed  Objective: Telemetry: Normal sinus rhythm Physical Exam: Blood pressure (!) 153/89, pulse 76, temperature 97.6 F (36.4 C), temperature source Oral, resp. rate 18, height 6' 2.5" (1.892 m), weight 72.6 kg, SpO2 98 %. Body mass index is 20.27 kg/m. General: Well developed, well nourished, in no acute distress. Head: Normocephalic, atraumatic, sclera non-icteric, no xanthomas, nares are without discharge. Neck: No apparent masses Lungs: Normal respirations with no wheezes, no rhonchi, no rales , no crackles   Heart: Regular rate and rhythm, normal S1 S2, no murmur, no rub, no gallop, PMI is normal size and placement, carotid upstroke normal without bruit, jugular venous pressure normal Abdomen: Soft, non-tender, non-distended with normoactive bowel sounds. No hepatosplenomegaly. Abdominal aorta is normal size without bruit Extremities: No edema, no clubbing, no cyanosis, no ulcers,  Peripheral: 2+ radial, 2+ femoral, 2+ dorsal pedal  pulses Neuro: Alert and oriented. Moves all extremities spontaneously. Psych:  Responds to questions appropriately with a normal affect.   Intake/Output Summary (Last 24 hours) at 12/29/2021 0751 Last data filed at 12/29/2021 0336 Gross per 24 hour  Intake 740 ml  Output 200 ml  Net 540 ml    Inpatient Medications:   amLODipine  10 mg Oral BH-q7a   carvedilol  25 mg Oral BID   feeding supplement  237 mL Oral TID BM   hydrALAZINE  50 mg Oral BID   influenza vac split quadrivalent PF  0.5 mL Intramuscular Tomorrow-1000   multivitamin with minerals  1 tablet Oral Daily   nicotine  14 mg Transdermal Daily   pneumococcal 23 valent vaccine  0.5 mL Intramuscular Tomorrow-1000   sodium zirconium cyclosilicate  10 g Oral BID   tamsulosin  0.4 mg Oral QPC supper   Infusions:   Labs: Recent Labs    12/28/21 0440 12/29/21 0512  NA 135 130*  K 4.8 5.3*  CL 110 105  CO2 18* 18*  GLUCOSE 99 213*  BUN 54* 62*  CREATININE 5.16* 5.49*  CALCIUM 8.3* 8.1*   Recent Labs    12/27/21 0404  AST 19  ALT 19  ALKPHOS 52  BILITOT 0.6  PROT 6.6  ALBUMIN 3.1*   Recent Labs    12/28/21 0440 12/29/21 0512  WBC 9.5 6.8  HGB 10.9* 10.6*  HCT 33.3* 32.4*  MCV 92.0 92.0  PLT 177 185   No results for input(s): CKTOTAL, CKMB, TROPONINI in the last 72 hours. Invalid input(s): POCBNP No results for input(s): HGBA1C in the last 72 hours.   Weights: Filed Weights   12/27/21 0351  Weight: 72.6 kg     Radiology/Studies:  DG Chest 2 View  Result Date: 12/27/2021 CLINICAL DATA:  61 year old male with shortness of  breath. Right flank pain and hematuria. EXAM: CHEST - 2 VIEW COMPARISON:  None. FINDINGS: Cardiomegaly. Mildly tortuous thoracic aorta. Other mediastinal contours are within normal limits. Visualized tracheal air column is within normal limits. Normal lung volumes. No pneumothorax, pulmonary edema, pleural effusion or confluent pulmonary opacity. No acute osseous  abnormality identified. Negative visible bowel gas. IMPRESSION: Cardiomegaly.  No acute cardiopulmonary abnormality. Electronically Signed   By: Genevie Ann M.D.   On: 12/27/2021 04:33   DG OR UROLOGY CYSTO IMAGE ([facility] ONLY)  Result Date: 12/28/2021 There is no interpretation for this exam.  This order is for images obtained during a surgical procedure.  Please See "Surgeries" Tab for more information regarding the procedure.   ECHOCARDIOGRAM COMPLETE  Result Date: 12/28/2021    ECHOCARDIOGRAM REPORT   Patient Name:   Ricky Simmons Date of Exam: 12/28/2021 Medical Rec #:  [id number]       Height:       74.5 in Accession #:    [id number]      Weight:       160.0 lb Date of Birth:  [birth year]       BSA:          1.986 m Patient Age:    61 years        BP:           171/91 mmHg Patient Gender: M               HR:           84 bpm. Exam Location:  [facility] Procedure: 2D Echo, Cardiac Doppler and Color Doppler Indications:     CHF-acute diastolic N39.76  History:         Patient has no prior history of Echocardiogram examinations.                  Risk Factors:Hypertension. CKD.  Sonographer:     Sherrie Sport Referring Phys:  [place] Diagnosing Phys: Serafina Royals MD IMPRESSIONS  1. Left ventricular ejection fraction, by estimation, is 30 to 35%. The left ventricle has moderately decreased function. The left ventricle demonstrates global hypokinesis. The left ventricular internal cavity size was mildly dilated. There is moderate  left ventricular hypertrophy. Left ventricular diastolic parameters are consistent with Grade II diastolic dysfunction (pseudonormalization).  2. Right ventricular systolic function is normal. The right ventricular size is normal.  3. Left atrial size was mild to moderately dilated.  4. The mitral valve is normal in structure. Moderate mitral valve regurgitation.  5. The aortic valve is normal in structure. Aortic valve regurgitation is trivial. FINDINGS  Left Ventricle: Left  ventricular ejection fraction, by estimation, is 30 to 35%. The left ventricle has moderately decreased function. The left ventricle demonstrates global hypokinesis. The left ventricular internal cavity size was mildly dilated. There is moderate left ventricular hypertrophy. Left ventricular diastolic parameters are consistent with Grade II diastolic dysfunction (pseudonormalization). Right Ventricle: The right ventricular size is normal. No increase in right ventricular wall thickness. Right ventricular systolic function is normal. Left Atrium: Left atrial size was mild to moderately dilated. Right Atrium: Right atrial size was normal in size. Pericardium: Trivial pericardial effusion is present. Mitral Valve: The mitral valve is normal in structure. Moderate mitral valve regurgitation. MV peak gradient, 5.3 mmHg. The mean mitral valve gradient is 3.0 mmHg. Tricuspid Valve: The tricuspid valve is normal in structure. Tricuspid valve regurgitation is mild. Aortic Valve: The aortic valve is normal in structure. Aortic  valve regurgitation is trivial. Aortic valve mean gradient measures 5.0 mmHg. Aortic valve peak gradient measures 8.8 mmHg. Aortic valve area, by VTI measures 2.27 cm. Pulmonic Valve: The pulmonic valve was normal in structure. Pulmonic valve regurgitation is trivial. Aorta: The aortic root and ascending aorta are structurally normal, with no evidence of dilitation. IAS/Shunts: No atrial level shunt detected by color flow Doppler.  LEFT VENTRICLE PLAX 2D LVIDd:         6.10 cm      Diastology LVIDs:         4.60 cm      LV e' medial:    3.92 cm/s LV PW:         1.40 cm      LV E/e' medial:  17.7 LV IVS:        1.20 cm      LV e' lateral:   4.24 cm/s LVOT diam:     2.10 cm      LV E/e' lateral: 16.4 LV SV:         56 LV SV Index:   28 LVOT Area:     3.46 cm  LV Volumes (MOD) LV vol d, MOD A2C: 163.0 ml LV vol d, MOD A4C: 147.0 ml LV vol s, MOD A2C: 93.7 ml LV vol s, MOD A4C: 76.0 ml LV SV MOD A2C:      69.3 ml LV SV MOD A4C:     147.0 ml LV SV MOD BP:      74.6 ml RIGHT VENTRICLE RV Basal diam:  3.00 cm RV S prime:     17.30 cm/s TAPSE (M-mode): 4.0 cm LEFT ATRIUM              Index        RIGHT ATRIUM           Index LA diam:        4.40 cm  2.22 cm/m   RA Area:     27.70 cm LA Vol (A2C):   121.0 ml 60.93 ml/m  RA Volume:   98.70 ml  49.70 ml/m LA Vol (A4C):   103.0 ml 51.86 ml/m LA Biplane Vol: 114.0 ml 57.40 ml/m  AORTIC VALVE                    PULMONIC VALVE AV Area (Vmax):    2.03 cm     PV Vmax:        0.66 m/s AV Area (Vmean):   2.14 cm     PV Vmean:       39.550 cm/s AV Area (VTI):     2.27 cm     PV VTI:         0.092 m AV Vmax:           148.00 cm/s  PV Peak grad:   1.8 mmHg AV Vmean:          99.400 cm/s  PV Mean grad:   1.0 mmHg AV VTI:            0.249 m      RVOT Peak grad: 3 mmHg AV Peak Grad:      8.8 mmHg AV Mean Grad:      5.0 mmHg LVOT Vmax:         86.80 cm/s LVOT Vmean:        61.300 cm/s LVOT VTI:          0.163 m LVOT/AV VTI ratio: 0.65  AORTA Ao Root diam: 3.60 cm MITRAL VALVE                TRICUSPID VALVE MV Area (PHT): 7.66 cm     TR Peak grad:   17.0 mmHg MV Area VTI:   2.38 cm     TR Vmax:        206.00 cm/s MV Peak grad:  5.3 mmHg MV Mean grad:  3.0 mmHg     SHUNTS MV Vmax:       1.15 m/s     Systemic VTI:  0.16 m MV Vmean:      75.6 cm/s    Systemic Diam: 2.10 cm MV Decel Time: 99 msec      Pulmonic VTI:  0.146 m MV E velocity: 69.40 cm/s MV A velocity: 122.00 cm/s MV E/A ratio:  0.57 Serafina Royals MD Electronically signed by Serafina Royals MD Signature Date/Time: 12/28/2021/9:27:32 AM    Final    CT Renal Stone Study  Result Date: 12/27/2021 CLINICAL DATA:  Flank pain.  Evaluate for kidney stone EXAM: CT ABDOMEN AND PELVIS WITHOUT CONTRAST TECHNIQUE: Multidetector CT imaging of the abdomen and pelvis was performed following the standard protocol without IV contrast. RADIATION DOSE REDUCTION: This exam was performed according to the departmental dose-optimization  program which includes automated exposure control, adjustment of the mA and/or kV according to patient size and/or use of iterative reconstruction technique. COMPARISON:  None. FINDINGS: Lower chest: Heart size is enlarged. No pleural effusion or edema. Small pericardial effusion noted. Hepatobiliary: No focal liver abnormality is seen. No gallstones, gallbladder wall thickening, or biliary dilatation. Pancreas: Unremarkable. No pancreatic ductal dilatation or surrounding inflammatory changes. Spleen: Normal in size without focal abnormality. Adrenals/Urinary Tract: Normal adrenal glands. There is a tiny stone within the right UVJ measuring 2-3 mm, image 80/2. This results in mild right hydronephrosis and hydroureter with perinephric fat stranding. Several punctate stones are identified within the upper pole the left kidney. No left-sided hydronephrosis. -Within the upper pole of the right kidney there is a 2.5 cm, 9.55 Hounsfield units, image 36/2. This is compatible with a benign cyst. -Arising off the lateral cortex of the left kidney is a 1.7 cm exophytic lesion measuring 29 Hounsfield units, image 39/2. This does not meet criteria for a simple cyst. Stomach/Bowel: Stomach is within normal limits. Appendix appears normal. No evidence of bowel wall thickening, distention, or inflammatory changes. Sigmoid diverticulosis without signs of acute diverticulitis. Vascular/Lymphatic: Aortic atherosclerosis. No signs of abdominopelvic adenopathy. Reproductive: Prostate is unremarkable. Other: Trace fluid noted within the dependent portion of the pelvis. Musculoskeletal: Spondylosis identified within the lumbar spine. No acute or suspicious osseous findings. IMPRESSION: 1. Right-sided hydronephrosis and hydroureter secondary to a 2-3 mm right UVJ calculus. 2. Nonobstructing left nephrolithiasis. 3. Indeterminate 1.7 cm exophytic lesion arising off the lateral cortex of the left kidney. This does not meet criteria for a  simple cyst. When the patient is clinically stable and able to follow directions and hold their breath (preferably as an outpatient) further evaluation with dedicated abdominal MRI without and with contrast material should be considered. 4. Cardiomegaly with small pericardial effusion. 5. Sigmoid diverticulosis without signs of acute diverticulitis. 6. Aortic Atherosclerosis (ICD10-I70.0). Electronically Signed   By: Kerby Moors M.D.   On: 12/27/2021 06:47     Assessment and Recommendation  61 y.o. male with malignant hypertension chronic kidney disease stage IV with mild to moderate LV systolic dysfunction likely secondary to uncontrolled hypertension over the years now  status post renal stent placement and improved 1.  Continue current medication management and increase hydralazine to 100 mg twice per day 2.  Continue to avoid angiotensin receptor blocker and ACE inhibitor due to concerns of chronic kidney disease 3.  No further cardiac diagnostics necessary at this time 4.  Supportive care for kidney dysfunction and kidney pain  Signed, Serafina Royals M.D. FACC

## 2021-12-29 NOTE — Progress Notes (Signed)
Patient ID: Ricky Simmons, male   DOB: 1960-12-23, 61 y.o.   MRN: [id number] [facility] PROGRESS NOTE  DONNY HEFFERN UDJ:[id number] DOB: 08/24/1961 DOA: 12/27/2021 PCP: Marinda Elk, MD  HPI/Subjective: Patient this morning still having some abdominal pain.  Pain medication.  Today's kidney function is worse than yesterday and potassium is also elevated.  Admitted with abdominal pain and found to have a kidney stone with hydronephrosis.  Objective: Vitals:   12/29/21 0335 12/29/21 0818  BP: (!) 153/89 (!) 155/108  Pulse: 76 77  Resp: 18 19  Temp: 97.6 F (36.4 C) 98.2 F (36.8 C)  SpO2: 98% 98%    Intake/Output Summary (Last 24 hours) at 12/29/2021 1446 Last data filed at 12/29/2021 5885 Gross per 24 hour  Intake 740 ml  Output 350 ml  Net 390 ml   Filed Weights   12/27/21 0351  Weight: 72.6 kg    ROS: Review of Systems  Respiratory:  Negative for shortness of breath.   Cardiovascular:  Negative for chest pain.  Gastrointestinal:  Negative for abdominal pain, nausea and vomiting.  Exam: Physical Exam HENT:     Head: Normocephalic.     Mouth/Throat:     Pharynx: No oropharyngeal exudate.  Eyes:     General: Lids are normal.     Conjunctiva/sclera: Conjunctivae normal.  Cardiovascular:     Rate and Rhythm: Normal rate and regular rhythm.     Heart sounds: Normal heart sounds, S1 normal and S2 normal.  Pulmonary:     Breath sounds: Normal breath sounds. No decreased breath sounds, wheezing, rhonchi or rales.  Abdominal:     Palpations: Abdomen is soft.     Tenderness: There is no abdominal tenderness.  Musculoskeletal:     Right lower leg: No swelling.     Left lower leg: No swelling.  Skin:    General: Skin is warm.     Findings: No rash.  Neurological:     Mental Status: He is alert and oriented to person, place, and time.      Scheduled Meds:  amLODipine  10 mg Oral BH-q7a   carvedilol  25 mg Oral BID   feeding supplement  237 mL Oral  TID BM   hydrALAZINE  100 mg Oral Q8H   influenza vac split quadrivalent PF  0.5 mL Intramuscular Tomorrow-1000   multivitamin with minerals  1 tablet Oral Daily   nicotine  14 mg Transdermal Daily   pneumococcal 23 valent vaccine  0.5 mL Intramuscular Tomorrow-1000   sodium zirconium cyclosilicate  10 g Oral BID   tamsulosin  0.4 mg Oral QPC supper     Assessment/Plan:  Acute kidney injury on chronic kidney disease stage IIIb with baseline creatinine about 1.85.  Creatinine worsening again today with creatinine up to 5.49.  Case discussed with nephrology and a 24-hour urine will be ordered and serology test ordered.  Patient may end up needing a kidney biopsy. Hyperkalemia.  We will give Lokelma twice a day with worsening kidney function.  Recheck potassium tomorrow morning Right distal ureteral calculus with right-sided hydronephrosis and hydroureter.  Dr. Jeb Levering did a cystoscopy on 12/28/2021 and had a stone extraction and stent placement.  Will need follow-up for stent removal as outpatient.  Patient on Flomax and will add as needed Ditropan. Accelerated hypertension on Coreg, amlodipine, hydralazine.  We will add as needed clonidine. Impending CHF with elevated BNP and cardiomyopathy with an EF of 30 to 35%.  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 1.7 cm indeterminate lesion of the left kidney.  Likely will need follow-up MRI as outpatient.  Urology already following.     Code Status:     Code Status Orders  (From admission, onward)           Start     Ordered   12/27/21 0832  Full code  Continuous        12/27/21 0833           Code Status History     This patient has a current code status but no historical code status.      Family Communication: Updated patient's wife on the phone Disposition Plan: Status is: Inpatient.  With worsening kidney function we will have to watch closely here in the hospital until kidney function peaks and gets better.  [place]  [facility] [employer]

## 2021-12-29 NOTE — Progress Notes (Signed)
[facility]  ROUNDING NOTE   Subjective:   Ricky Simmons is a 61 y.o. male with a past medical history of hypertension, nephrolithiasis, and chronic kidney disease stage III. He presents to the ED with complaints of worsening pain on the right side. He has been admitted for Kidney stone [J18.8] Renal colic on right side [C16] AKI (acute kidney injury) ([place]) [N17.9] Hypertensive emergency [I16.1]  Patient found resting in bed, in no acute distress. He underwent cystoscopy,right ureteroscopy with basket extraction of distal ureteral stone and stent placement on 12/28/2020. His renal function is still worsening, with Creatinine of 5.49 today. He is voiding pink tinged urine using the urinal.   Objective:  Vital signs in last 24 hours:  Temp:  [97.6 F (36.4 C)-98.6 F (37 C)] 98.2 F (36.8 C) (01/21 0818) Pulse Rate:  [71-85] 77 (01/21 0818) Resp:  [10-29] 19 (01/21 0818) BP: (97-155)/(70-108) 155/108 (01/21 0818) SpO2:  [97 %-100 %] 98 % (01/21 0818)  Weight change:  Filed Weights   12/27/21 0351  Weight: 72.6 kg    Intake/Output: I/O last 3 completed shifts: In: 740 [P.O.:120; I.V.:500; IV Piggyback:120] Out: 2700 [Urine:2700]   Intake/Output this shift:  Total I/O In: -  Out: 150 [Urine:150]  Physical Exam: General: In no acute distress  Head: Normocephalic, atraumatic. Moist oral mucosal membranes  Eyes: Anicteric  Lungs:  Respirations even,unlabored, lungs clear  Heart: S1S2, no rubs or gallops  Abdomen:  Soft, non-tender,non distended  Extremities:  No peripheral edema.  Neurologic: Awake,alert,oriented  Skin: No lesions or rashes       Basic Metabolic Panel: Recent Labs  Lab 12/27/21 0404 12/28/21 0440 12/29/21 0512  NA 134* 135 130*  K 4.7 4.8 5.3*  CL 109 110 105  CO2 19* 18* 18*  GLUCOSE 119* 99 213*  BUN 49* 54* 62*  CREATININE 4.64* 5.16* 5.49*  CALCIUM 8.6* 8.3* 8.1*     Liver Function Tests: Recent Labs  Lab  12/27/21 0404  AST 19  ALT 19  ALKPHOS 52  BILITOT 0.6  PROT 6.6  ALBUMIN 3.1*    Recent Labs  Lab 12/27/21 0404  LIPASE 43    No results for input(s): AMMONIA in the last 168 hours.  CBC: Recent Labs  Lab 12/27/21 0404 12/28/21 0440 12/29/21 0512  WBC 12.6* 9.5 6.8  HGB 11.5* 10.9* 10.6*  HCT 35.2* 33.3* 32.4*  MCV 93.4 92.0 92.0  PLT 216 177 185     Cardiac Enzymes: No results for input(s): CKTOTAL, CKMB, CKMBINDEX, TROPONINI in the last 168 hours.  BNP: Invalid input(s): POCBNP  CBG: No results for input(s): GLUCAP in the last 168 hours.  Microbiology: Results for orders placed or performed during the hospital encounter of 12/27/21  Urine Culture     Status: None   Collection Time: 12/27/21  5:50 AM   Specimen: Urine, Clean Catch  Result Value Ref Range Status   Specimen Description   Final    URINE, CLEAN CATCH Performed at [facility], [address] [place], [place] [address]    Special Requests   Final    NONE Performed at [facility], [address], [place], [place] [address]    Culture   Final    NO GROWTH Performed at [facility], [place] [address] [place], [place] [address]    Report Status 12/28/2021 FINAL  Final  Resp Panel by RT-PCR (Flu A&B, Covid) Nasopharyngeal Swab     Status: None  Collection Time: 12/27/21  7:17 AM   Specimen: Nasopharyngeal Swab; Nasopharyngeal(NP) swabs in vial transport medium  Result Value Ref Range Status   SARS Coronavirus 2 by RT PCR NEGATIVE NEGATIVE Final    Comment: (NOTE) SARS-CoV-2 target nucleic acids are NOT DETECTED.  The SARS-CoV-2 RNA is generally detectable in upper respiratory specimens during the acute phase of infection. The lowest concentration of SARS-CoV-2 viral copies this assay can detect is 138 copies/mL. A negative result does not preclude SARS-Cov-2 infection and should not be used as the sole basis for treatment or other patient management  decisions. A negative result may occur with  improper specimen collection/handling, submission of specimen other than nasopharyngeal swab, presence of viral mutation(s) within the areas targeted by this assay, and inadequate number of viral copies(<138 copies/mL). A negative result must be combined with clinical observations, patient history, and epidemiological information. The expected result is Negative.  Fact Sheet for Patients:  EntrepreneurPulse.com.au  Fact Sheet for Healthcare Providers:  IncredibleEmployment.be  This test is no t yet approved or cleared by the [place] FDA and  has been authorized for detection and/or diagnosis of SARS-CoV-2 by FDA under an Emergency Use Authorization (EUA). This EUA will remain  in effect (meaning this test can be used) for the duration of the COVID-19 declaration under Section 564(b)(1) of the Act, 21 U.S.C.section 360bbb-3(b)(1), unless the authorization is terminated  or revoked sooner.       Influenza A by PCR NEGATIVE NEGATIVE Final   Influenza B by PCR NEGATIVE NEGATIVE Final    Comment: (NOTE) The Xpert Xpress SARS-CoV-2/FLU/RSV plus assay is intended as an aid in the diagnosis of influenza from Nasopharyngeal swab specimens and should not be used as a sole basis for treatment. Nasal washings and aspirates are unacceptable for Xpert Xpress SARS-CoV-2/FLU/RSV testing.  Fact Sheet for Patients: EntrepreneurPulse.com.au  Fact Sheet for Healthcare Providers: IncredibleEmployment.be  This test is not yet approved or cleared by the [place] FDA and has been authorized for detection and/or diagnosis of SARS-CoV-2 by FDA under an Emergency Use Authorization (EUA). This EUA will remain in effect (meaning this test can be used) for the duration of the COVID-19 declaration under Section 564(b)(1) of the Act, 21 U.S.C. section 360bbb-3(b)(1), unless the  authorization is terminated or revoked.  Performed at [facility], [address] [place], [place] [address]     Coagulation Studies: No results for input(s): LABPROT, INR in the last 72 hours.  Urinalysis: Recent Labs    12/27/21 0550  COLORURINE YELLOW*  LABSPEC 1.017  PHURINE 5.0  GLUCOSEU 50*  HGBUR SMALL*  BILIRUBINUR NEGATIVE  KETONESUR 5*  PROTEINUR >=300*  NITRITE NEGATIVE  LEUKOCYTESUR NEGATIVE       Imaging: DG OR UROLOGY CYSTO IMAGE ([facility] ONLY)  Result Date: 12/28/2021 There is no interpretation for this exam.  This order is for images obtained during a surgical procedure.  Please See "Surgeries" Tab for more information regarding the procedure.   ECHOCARDIOGRAM COMPLETE  Result Date: 12/28/2021    ECHOCARDIOGRAM REPORT   Patient Name:   Ricky Simmons Date of Exam: 12/28/2021 Medical Rec #:  [id number]       Height:       74.5 in Accession #:    [id number]      Weight:       160.0 lb Date of Birth:  [birth year]       BSA:          1.986 m Patient  Age:    47 years        BP:           171/91 mmHg Patient Gender: M               HR:           84 bpm. Exam Location:  [facility] Procedure: 2D Echo, Cardiac Doppler and Color Doppler Indications:     CHF-acute diastolic E72.09  History:         Patient has no prior history of Echocardiogram examinations.                  Risk Factors:Hypertension. CKD.  Sonographer:     Sherrie Sport Referring Phys:  [place] Diagnosing Phys: Serafina Royals MD IMPRESSIONS  1. Left ventricular ejection fraction, by estimation, is 30 to 35%. The left ventricle has moderately decreased function. The left ventricle demonstrates global hypokinesis. The left ventricular internal cavity size was mildly dilated. There is moderate  left ventricular hypertrophy. Left ventricular diastolic parameters are consistent with Grade II diastolic dysfunction (pseudonormalization).  2. Right ventricular systolic function is normal. The right  ventricular size is normal.  3. Left atrial size was mild to moderately dilated.  4. The mitral valve is normal in structure. Moderate mitral valve regurgitation.  5. The aortic valve is normal in structure. Aortic valve regurgitation is trivial. FINDINGS  Left Ventricle: Left ventricular ejection fraction, by estimation, is 30 to 35%. The left ventricle has moderately decreased function. The left ventricle demonstrates global hypokinesis. The left ventricular internal cavity size was mildly dilated. There is moderate left ventricular hypertrophy. Left ventricular diastolic parameters are consistent with Grade II diastolic dysfunction (pseudonormalization). Right Ventricle: The right ventricular size is normal. No increase in right ventricular wall thickness. Right ventricular systolic function is normal. Left Atrium: Left atrial size was mild to moderately dilated. Right Atrium: Right atrial size was normal in size. Pericardium: Trivial pericardial effusion is present. Mitral Valve: The mitral valve is normal in structure. Moderate mitral valve regurgitation. MV peak gradient, 5.3 mmHg. The mean mitral valve gradient is 3.0 mmHg. Tricuspid Valve: The tricuspid valve is normal in structure. Tricuspid valve regurgitation is mild. Aortic Valve: The aortic valve is normal in structure. Aortic valve regurgitation is trivial. Aortic valve mean gradient measures 5.0 mmHg. Aortic valve peak gradient measures 8.8 mmHg. Aortic valve area, by VTI measures 2.27 cm. Pulmonic Valve: The pulmonic valve was normal in structure. Pulmonic valve regurgitation is trivial. Aorta: The aortic root and ascending aorta are structurally normal, with no evidence of dilitation. IAS/Shunts: No atrial level shunt detected by color flow Doppler.  LEFT VENTRICLE PLAX 2D LVIDd:         6.10 cm      Diastology LVIDs:         4.60 cm      LV e' medial:    3.92 cm/s LV PW:         1.40 cm      LV E/e' medial:  17.7 LV IVS:        1.20 cm      LV e'  lateral:   4.24 cm/s LVOT diam:     2.10 cm      LV E/e' lateral: 16.4 LV SV:         56 LV SV Index:   28 LVOT Area:     3.46 cm  LV Volumes (MOD) LV vol d, MOD A2C: 163.0 ml LV  vol d, MOD A4C: 147.0 ml LV vol s, MOD A2C: 93.7 ml LV vol s, MOD A4C: 76.0 ml LV SV MOD A2C:     69.3 ml LV SV MOD A4C:     147.0 ml LV SV MOD BP:      74.6 ml RIGHT VENTRICLE RV Basal diam:  3.00 cm RV S prime:     17.30 cm/s TAPSE (M-mode): 4.0 cm LEFT ATRIUM              Index        RIGHT ATRIUM           Index LA diam:        4.40 cm  2.22 cm/m   RA Area:     27.70 cm LA Vol (A2C):   121.0 ml 60.93 ml/m  RA Volume:   98.70 ml  49.70 ml/m LA Vol (A4C):   103.0 ml 51.86 ml/m LA Biplane Vol: 114.0 ml 57.40 ml/m  AORTIC VALVE                    PULMONIC VALVE AV Area (Vmax):    2.03 cm     PV Vmax:        0.66 m/s AV Area (Vmean):   2.14 cm     PV Vmean:       39.550 cm/s AV Area (VTI):     2.27 cm     PV VTI:         0.092 m AV Vmax:           148.00 cm/s  PV Peak grad:   1.8 mmHg AV Vmean:          99.400 cm/s  PV Mean grad:   1.0 mmHg AV VTI:            0.249 m      RVOT Peak grad: 3 mmHg AV Peak Grad:      8.8 mmHg AV Mean Grad:      5.0 mmHg LVOT Vmax:         86.80 cm/s LVOT Vmean:        61.300 cm/s LVOT VTI:          0.163 m LVOT/AV VTI ratio: 0.65  AORTA Ao Root diam: 3.60 cm MITRAL VALVE                TRICUSPID VALVE MV Area (PHT): 7.66 cm     TR Peak grad:   17.0 mmHg MV Area VTI:   2.38 cm     TR Vmax:        206.00 cm/s MV Peak grad:  5.3 mmHg MV Mean grad:  3.0 mmHg     SHUNTS MV Vmax:       1.15 m/s     Systemic VTI:  0.16 m MV Vmean:      75.6 cm/s    Systemic Diam: 2.10 cm MV Decel Time: 99 msec      Pulmonic VTI:  0.146 m MV E velocity: 69.40 cm/s MV A velocity: 122.00 cm/s MV E/A ratio:  0.57 Serafina Royals MD Electronically signed by Serafina Royals MD Signature Date/Time: 12/28/2021/9:27:32 AM    Final      Medications:     amLODipine  10 mg Oral BH-q7a   carvedilol  25 mg Oral BID   feeding  supplement  237 mL Oral TID BM   hydrALAZINE  100 mg Oral Q8H   influenza vac split quadrivalent  PF  0.5 mL Intramuscular Tomorrow-1000   multivitamin with minerals  1 tablet Oral Daily   nicotine  14 mg Transdermal Daily   pneumococcal 23 valent vaccine  0.5 mL Intramuscular Tomorrow-1000   sodium zirconium cyclosilicate  10 g Oral BID   tamsulosin  0.4 mg Oral QPC supper   acetaminophen **OR** acetaminophen, acetaminophen, hydrALAZINE, HYDROmorphone (DILAUDID) injection, ondansetron **OR** ondansetron (ZOFRAN) IV  Assessment/ Plan:  Ricky Simmons is a 61 y.o.  male with a past medical history of hypertension, nephrolithiasis, and chronic kidney disease stage III. He presents to the ED with complaints of worsening pain on the right side. He has been admitted for Kidney stone [F68.1] Renal colic on right side [E75] AKI (acute kidney injury) ([place]) [N17.9] Hypertensive emergency [I16.1]   Acute Kidney Injury on chronic kidney disease stage IIIb with baseline creatinine 1.85 and GFR of 40.  Acute kidney injury secondary to hydronephrosis from renal stone Chronic kidney disease is secondary to hypertension   Lab Results  Component Value Date   CREATININE 5.49 (H) 12/29/2021   CREATININE 5.16 (H) 12/28/2021   CREATININE 4.64 (H) 12/27/2021    Intake/Output Summary (Last 24 hours) at 12/29/2021 1051 Last data filed at 12/29/2021 1700 Gross per 24 hour  Intake 740 ml  Output 350 ml  Net 390 ml   He underwent cystoscopy,right ureteroscopy with basket extraction of distal ureteral stone and stent placement on 12/28/2020. Plan for 24 hours urine study as his UA with proteinuria Dr.Singh discussed possible need for biopsy to evaluate further,if renal function is not improving  2. Hypertension with chronic kidney disease. Home regimen includes amlodipine, carvedilol hydralazine and losartan. Losartan held due to decreased renal function.  Currently on Amlodipine, Carvedilol and  Hydralazine BP today 155/108 mm of Hg    LOS: 2 Serra Younan 1/21/202310:51 AM

## 2021-12-30 LAB — BASIC METABOLIC PANEL
Anion gap: 11 (ref 5–15)
BUN: 70 mg/dL — ABNORMAL HIGH (ref 6–20)
CO2: 21 mmol/L — ABNORMAL LOW (ref 22–32)
Calcium: 8.3 mg/dL — ABNORMAL LOW (ref 8.9–10.3)
Chloride: 104 mmol/L (ref 98–111)
Creatinine, Ser: 4.93 mg/dL — ABNORMAL HIGH (ref 0.61–1.24)
GFR, Estimated: 13 mL/min — ABNORMAL LOW (ref >60)
Glucose, Bld: 92 mg/dL (ref 70–99)
Potassium: 4.4 mmol/L (ref 3.5–5.1)
Sodium: 136 mmol/L (ref 135–145)

## 2021-12-30 LAB — PROTEIN, URINE, 24 HOUR
Collection Interval-UPROT: 24 hours
Protein, 24H Urine: 4186 mg/d — ABNORMAL HIGH (ref 50–100)
Protein, Urine: 161 mg/dL
Urine Total Volume-UPROT: 2600 mL

## 2021-12-30 NOTE — Progress Notes (Signed)
[facility]  ROUNDING NOTE   Subjective:   Ricky Simmons is a 61 y.o. male with a past medical history of hypertension, nephrolithiasis, and chronic kidney disease stage III. He presents to the ED with complaints of worsening pain on the right side. He has been admitted for Kidney stone [K93.8] Renal colic on right side [H82] AKI (acute kidney injury) ([place]) [N17.9] Hypertensive emergency [I16.1]  Patient found resting in bed, in no acute distress. He underwent cystoscopy,right ureteroscopy with basket extraction of distal ureteral stone and stent placement on 12/28/2020 Patient states he overall feels better compared to yesterday Serum creatinine has improved some as noted below Able to eat without nausea or vomiting No shortness of breath or leg edema 24-hour urine collection is in progress.  Objective:  Vital signs in last 24 hours:  Temp:  [98 F (36.7 C)-98.8 F (37.1 C)] 98 F (36.7 C) (01/22 0754) Pulse Rate:  [71-89] 73 (01/22 0754) Resp:  [18-20] 18 (01/22 0754) BP: (132-135)/(78-91) 132/85 (01/22 0754) SpO2:  [94 %-99 %] 99 % (01/22 0754)  Weight change:  Filed Weights   12/27/21 0351  Weight: 72.6 kg    Intake/Output: I/O last 3 completed shifts: In: -  Out: 1330 [Urine:1330]   Intake/Output this shift:  Total I/O In: 240 [P.O.:240] Out: 580 [Urine:580]  Physical Exam: General: In no acute distress  Head: Normocephalic, atraumatic. Moist oral mucosal membranes  Eyes: Anicteric  Lungs:  Respirations even,unlabored, lungs clear  Heart: S1S2, no rubs or gallops  Abdomen:  Soft, non-tender,non distended  Extremities:  No peripheral edema.  Neurologic: Awake,alert,oriented  Skin: No lesions or rashes       Basic Metabolic Panel: Recent Labs  Lab 12/27/21 0404 12/28/21 0440 12/29/21 0512 12/30/21 0817  NA 134* 135 130* 136  K 4.7 4.8 5.3* 4.4  CL 109 110 105 104  CO2 19* 18* 18* 21*  GLUCOSE 119* 99 213* 92  BUN 49* 54* 62* 70*   CREATININE 4.64* 5.16* 5.49* 4.93*  CALCIUM 8.6* 8.3* 8.1* 8.3*     Liver Function Tests: Recent Labs  Lab 12/27/21 0404  AST 19  ALT 19  ALKPHOS 52  BILITOT 0.6  PROT 6.6  ALBUMIN 3.1*    Recent Labs  Lab 12/27/21 0404  LIPASE 43    No results for input(s): AMMONIA in the last 168 hours.  CBC: Recent Labs  Lab 12/27/21 0404 12/28/21 0440 12/29/21 0512  WBC 12.6* 9.5 6.8  HGB 11.5* 10.9* 10.6*  HCT 35.2* 33.3* 32.4*  MCV 93.4 92.0 92.0  PLT 216 177 185     Cardiac Enzymes: No results for input(s): CKTOTAL, CKMB, CKMBINDEX, TROPONINI in the last 168 hours.  BNP: Invalid input(s): POCBNP  CBG: No results for input(s): GLUCAP in the last 168 hours.  Microbiology: Results for orders placed or performed during the hospital encounter of 12/27/21  Urine Culture     Status: None   Collection Time: 12/27/21  5:50 AM   Specimen: Urine, Clean Catch  Result Value Ref Range Status   Specimen Description   Final    URINE, CLEAN CATCH Performed at [facility], [address] [place], [place] [address]    Special Requests   Final    NONE Performed at [facility], [address] [place], [place] [address]    Culture   Final    NO GROWTH Performed at [facility], [place] [address] [place], [place] [address]  Report Status 12/28/2021 FINAL  Final  Resp Panel by RT-PCR (Flu A&B, Covid) Nasopharyngeal Swab     Status: None   Collection Time: 12/27/21  7:17 AM   Specimen: Nasopharyngeal Swab; Nasopharyngeal(NP) swabs in vial transport medium  Result Value Ref Range Status   SARS Coronavirus 2 by RT PCR NEGATIVE NEGATIVE Final    Comment: (NOTE) SARS-CoV-2 target nucleic acids are NOT DETECTED.  The SARS-CoV-2 RNA is generally detectable in upper respiratory specimens during the acute phase of infection. The lowest concentration of SARS-CoV-2 viral copies this assay can detect is 138 copies/mL. A negative result does not preclude  SARS-Cov-2 infection and should not be used as the sole basis for treatment or other patient management decisions. A negative result may occur with  improper specimen collection/handling, submission of specimen other than nasopharyngeal swab, presence of viral mutation(s) within the areas targeted by this assay, and inadequate number of viral copies(<138 copies/mL). A negative result must be combined with clinical observations, patient history, and epidemiological information. The expected result is Negative.  Fact Sheet for Patients:  EntrepreneurPulse.com.au  Fact Sheet for Healthcare Providers:  IncredibleEmployment.be  This test is no t yet approved or cleared by the [place] FDA and  has been authorized for detection and/or diagnosis of SARS-CoV-2 by FDA under an Emergency Use Authorization (EUA). This EUA will remain  in effect (meaning this test can be used) for the duration of the COVID-19 declaration under Section 564(b)(1) of the Act, 21 U.S.C.section 360bbb-3(b)(1), unless the authorization is terminated  or revoked sooner.       Influenza A by PCR NEGATIVE NEGATIVE Final   Influenza B by PCR NEGATIVE NEGATIVE Final    Comment: (NOTE) The Xpert Xpress SARS-CoV-2/FLU/RSV plus assay is intended as an aid in the diagnosis of influenza from Nasopharyngeal swab specimens and should not be used as a sole basis for treatment. Nasal washings and aspirates are unacceptable for Xpert Xpress SARS-CoV-2/FLU/RSV testing.  Fact Sheet for Patients: EntrepreneurPulse.com.au  Fact Sheet for Healthcare Providers: IncredibleEmployment.be  This test is not yet approved or cleared by the [place] FDA and has been authorized for detection and/or diagnosis of SARS-CoV-2 by FDA under an Emergency Use Authorization (EUA). This EUA will remain in effect (meaning this test can be used) for the duration of  the COVID-19 declaration under Section 564(b)(1) of the Act, 21 U.S.C. section 360bbb-3(b)(1), unless the authorization is terminated or revoked.  Performed at [facility], [address] [place], [place] [address]     Coagulation Studies: No results for input(s): LABPROT, INR in the last 72 hours.  Urinalysis: No results for input(s): COLORURINE, LABSPEC, PHURINE, GLUCOSEU, HGBUR, BILIRUBINUR, KETONESUR, PROTEINUR, UROBILINOGEN, NITRITE, LEUKOCYTESUR in the last 72 hours.  Invalid input(s): APPERANCEUR     Imaging: DG OR UROLOGY CYSTO IMAGE ([place])  Result Date: 12/28/2021 There is no interpretation for this exam.  This order is for images obtained during a surgical procedure.  Please See "Surgeries" Tab for more information regarding the procedure.     Medications:     amLODipine  10 mg Oral BH-q7a   carvedilol  25 mg Oral BID   feeding supplement  237 mL Oral TID BM   hydrALAZINE  100 mg Oral Q8H   influenza vac split quadrivalent PF  0.5 mL Intramuscular Tomorrow-1000   multivitamin with minerals  1 tablet Oral Daily   nicotine  14 mg Transdermal Daily   pneumococcal 23 valent vaccine  0.5 mL Intramuscular  Tomorrow-1000   sodium zirconium cyclosilicate  10 g Oral BID   tamsulosin  0.4 mg Oral QPC supper   acetaminophen **OR** acetaminophen, acetaminophen, cloNIDine, hydrALAZINE, HYDROmorphone (DILAUDID) injection, ondansetron **OR** ondansetron (ZOFRAN) IV, oxybutynin, oxyCODONE  Assessment/ Plan:  Ricky Simmons is a 61 y.o.  male with a past medical history of hypertension, nephrolithiasis, and chronic kidney disease stage III. He presents to the ED with complaints of worsening pain on the right side. He has been admitted for Kidney stone [T03.5] Renal colic on right side [W65] AKI (acute kidney injury) ([place]) [N17.9] Hypertensive emergency [I16.1]   Acute Kidney Injury on chronic kidney disease stage IIIb with proteinuria - baseline  creatinine 1.85 and GFR of 40.  Acute kidney injury secondary to hydronephrosis from renal stone Chronic kidney disease is secondary to hypertension Work-up for proteinuria is in progress.  24-hour urine collection for total protein is pending.  Patient may end up needing a kidney biopsy depending on serology and 24-hour urine results Serologies negative in the past.  Repeat assessment ordered, results pending.   Lab Results  Component Value Date   CREATININE 4.93 (H) 12/30/2021   CREATININE 5.49 (H) 12/29/2021   CREATININE 5.16 (H) 12/28/2021    Intake/Output Summary (Last 24 hours) at 12/30/2021 1236 Last data filed at 12/30/2021 0950 Gross per 24 hour  Intake 240 ml  Output 1660 ml  Net -1420 ml   He underwent cystoscopy,right ureteroscopy with basket extraction of distal ureteral stone and stent placement on 12/28/2020.   2. Hypertension with chronic kidney disease.  currently getting amlodipine, carvedilol, hydralazine.     LOS: [place] 1/22/202312:36 PM

## 2021-12-30 NOTE — Progress Notes (Signed)
[facility] Encounter Note  Patient: Ricky Simmons / Admit Date: 12/27/2021 / Date of Encounter: 12/30/2021, 7:57 AM   Subjective: Overall procedure went well with no evidence of complication.  Patient with less kidney pain.  Patient still has some significant hypertension although has had some stability since pain has improved and hydralazine has been added.. Patient has had continued issues with chronic kidney disease and may need further intervention  Echocardiogram showing mild to moderate LV systolic dysfunction with severe left ventricular hypertrophy likely secondary to significant hypertension over the years.  Patient still has a significant chronic kidney disease stage IV which complicates the concerns for treatment with hypertension.  Glomerular filtration rate appears to be significantly abnormal  Review of Systems: Positive for: Mild pain Negative for: Vision change, hearing change, syncope, dizziness, nausea, vomiting,diarrhea, bloody stool, stomach pain, cough, congestion, diaphoresis, urinary frequency, urinary pain,skin lesions, skin rashes Others previously listed  Objective: Telemetry: Normal sinus rhythm Physical Exam: Blood pressure 132/85, pulse 73, temperature 98 F (36.7 C), temperature source Oral, resp. rate 18, height 6' 2.5" (1.892 m), weight 72.6 kg, SpO2 99 %. Body mass index is 20.27 kg/m. General: Well developed, well nourished, in no acute distress. Head: Normocephalic, atraumatic, sclera non-icteric, no xanthomas, nares are without discharge. Neck: No apparent masses Lungs: Normal respirations with no wheezes, no rhonchi, no rales , no crackles   Heart: Regular rate and rhythm, normal S1 S2, no murmur, no rub, no gallop, PMI is normal size and placement, carotid upstroke normal without bruit, jugular venous pressure normal Abdomen: Soft, non-tender, non-distended with normoactive bowel sounds. No hepatosplenomegaly. Abdominal aorta is  normal size without bruit Extremities: No edema, no clubbing, no cyanosis, no ulcers,  Peripheral: 2+ radial, 2+ femoral, 2+ dorsal pedal pulses Neuro: Alert and oriented. Moves all extremities spontaneously. Psych:  Responds to questions appropriately with a normal affect.   Intake/Output Summary (Last 24 hours) at 12/30/2021 0757 Last data filed at 12/30/2021 0600 Gross per 24 hour  Intake --  Output 1230 ml  Net -1230 ml     Inpatient Medications:   amLODipine  10 mg Oral BH-q7a   carvedilol  25 mg Oral BID   feeding supplement  237 mL Oral TID BM   hydrALAZINE  100 mg Oral Q8H   influenza vac split quadrivalent PF  0.5 mL Intramuscular Tomorrow-1000   multivitamin with minerals  1 tablet Oral Daily   nicotine  14 mg Transdermal Daily   pneumococcal 23 valent vaccine  0.5 mL Intramuscular Tomorrow-1000   sodium zirconium cyclosilicate  10 g Oral BID   tamsulosin  0.4 mg Oral QPC supper   Infusions:   Labs: Recent Labs    12/28/21 0440 12/29/21 0512  NA 135 130*  K 4.8 5.3*  CL 110 105  CO2 18* 18*  GLUCOSE 99 213*  BUN 54* 62*  CREATININE 5.16* 5.49*  CALCIUM 8.3* 8.1*    No results for input(s): AST, ALT, ALKPHOS, BILITOT, PROT, ALBUMIN in the last 72 hours.  Recent Labs    12/28/21 0440 12/29/21 0512  WBC 9.5 6.8  HGB 10.9* 10.6*  HCT 33.3* 32.4*  MCV 92.0 92.0  PLT 177 185    No results for input(s): CKTOTAL, CKMB, TROPONINI in the last 72 hours. Invalid input(s): POCBNP No results for input(s): HGBA1C in the last 72 hours.   Weights: Filed Weights   12/27/21 0351  Weight: 72.6 kg     Radiology/Studies:  DG Chest 2  View  Result Date: 12/27/2021 CLINICAL DATA:  61 year old male with shortness of breath. Right flank pain and hematuria. EXAM: CHEST - 2 VIEW COMPARISON:  None. FINDINGS: Cardiomegaly. Mildly tortuous thoracic aorta. Other mediastinal contours are within normal limits. Visualized tracheal air column is within normal  limits. Normal lung volumes. No pneumothorax, pulmonary edema, pleural effusion or confluent pulmonary opacity. No acute osseous abnormality identified. Negative visible bowel gas. IMPRESSION: Cardiomegaly.  No acute cardiopulmonary abnormality. Electronically Signed   By: Genevie Ann M.D.   On: 12/27/2021 04:33   DG OR UROLOGY CYSTO IMAGE ([facility] ONLY)  Result Date: 12/28/2021 There is no interpretation for this exam.  This order is for images obtained during a surgical procedure.  Please See "Surgeries" Tab for more information regarding the procedure.   ECHOCARDIOGRAM COMPLETE  Result Date: 12/28/2021    ECHOCARDIOGRAM REPORT   Patient Name:   Ricky Simmons Date of Exam: 12/28/2021 Medical Rec #:  [id number]       Height:       74.5 in Accession #:    [id number]      Weight:       160.0 lb Date of Birth:  Dec 30, 1960       BSA:          1.986 m Patient Age:    61 years        BP:           171/91 mmHg Patient Gender: M               HR:           84 bpm. Exam Location:  [facility] Procedure: 2D Echo, Cardiac Doppler and Color Doppler Indications:     CHF-acute diastolic M01.02  History:         Patient has no prior history of Echocardiogram examinations.                  Risk Factors:Hypertension. CKD.  Sonographer:     Sherrie Sport Referring Phys:  [place] Diagnosing Phys: Serafina Royals MD IMPRESSIONS  1. Left ventricular ejection fraction, by estimation, is 30 to 35%. The left ventricle has moderately decreased function. The left ventricle demonstrates global hypokinesis. The left ventricular internal cavity size was mildly dilated. There is moderate  left ventricular hypertrophy. Left ventricular diastolic parameters are consistent with Grade II diastolic dysfunction (pseudonormalization).  2. Right ventricular systolic function is normal. The right ventricular size is normal.  3. Left atrial size was mild to moderately dilated.  4. The mitral valve is normal in structure. Moderate mitral valve  regurgitation.  5. The aortic valve is normal in structure. Aortic valve regurgitation is trivial. FINDINGS  Left Ventricle: Left ventricular ejection fraction, by estimation, is 30 to 35%. The left ventricle has moderately decreased function. The left ventricle demonstrates global hypokinesis. The left ventricular internal cavity size was mildly dilated. There is moderate left ventricular hypertrophy. Left ventricular diastolic parameters are consistent with Grade II diastolic dysfunction (pseudonormalization). Right Ventricle: The right ventricular size is normal. No increase in right ventricular wall thickness. Right ventricular systolic function is normal. Left Atrium: Left atrial size was mild to moderately dilated. Right Atrium: Right atrial size was normal in size. Pericardium: Trivial pericardial effusion is present. Mitral Valve: The mitral valve is normal in structure. Moderate mitral valve regurgitation. MV peak gradient, 5.3 mmHg. The mean mitral valve gradient is 3.0 mmHg. Tricuspid Valve: The tricuspid valve is normal in structure. Tricuspid valve  regurgitation is mild. Aortic Valve: The aortic valve is normal in structure. Aortic valve regurgitation is trivial. Aortic valve mean gradient measures 5.0 mmHg. Aortic valve peak gradient measures 8.8 mmHg. Aortic valve area, by VTI measures 2.27 cm. Pulmonic Valve: The pulmonic valve was normal in structure. Pulmonic valve regurgitation is trivial. Aorta: The aortic root and ascending aorta are structurally normal, with no evidence of dilitation. IAS/Shunts: No atrial level shunt detected by color flow Doppler.  LEFT VENTRICLE PLAX 2D LVIDd:         6.10 cm      Diastology LVIDs:         4.60 cm      LV e' medial:    3.92 cm/s LV PW:         1.40 cm      LV E/e' medial:  17.7 LV IVS:        1.20 cm      LV e' lateral:   4.24 cm/s LVOT diam:     2.10 cm      LV E/e' lateral: 16.4 LV SV:         56 LV SV Index:   28 LVOT Area:     3.46 cm  LV Volumes (MOD)  LV vol d, MOD A2C: 163.0 ml LV vol d, MOD A4C: 147.0 ml LV vol s, MOD A2C: 93.7 ml LV vol s, MOD A4C: 76.0 ml LV SV MOD A2C:     69.3 ml LV SV MOD A4C:     147.0 ml LV SV MOD BP:      74.6 ml RIGHT VENTRICLE RV Basal diam:  3.00 cm RV S prime:     17.30 cm/s TAPSE (M-mode): 4.0 cm LEFT ATRIUM              Index        RIGHT ATRIUM           Index LA diam:        4.40 cm  2.22 cm/m   RA Area:     27.70 cm LA Vol (A2C):   121.0 ml 60.93 ml/m  RA Volume:   98.70 ml  49.70 ml/m LA Vol (A4C):   103.0 ml 51.86 ml/m LA Biplane Vol: 114.0 ml 57.40 ml/m  AORTIC VALVE                    PULMONIC VALVE AV Area (Vmax):    2.03 cm     PV Vmax:        0.66 m/s AV Area (Vmean):   2.14 cm     PV Vmean:       39.550 cm/s AV Area (VTI):     2.27 cm     PV VTI:         0.092 m AV Vmax:           148.00 cm/s  PV Peak grad:   1.8 mmHg AV Vmean:          99.400 cm/s  PV Mean grad:   1.0 mmHg AV VTI:            0.249 m      RVOT Peak grad: 3 mmHg AV Peak Grad:      8.8 mmHg AV Mean Grad:      5.0 mmHg LVOT Vmax:         86.80 cm/s LVOT Vmean:        61.300 cm/s LVOT VTI:  0.163 m LVOT/AV VTI ratio: 0.65  AORTA Ao Root diam: 3.60 cm MITRAL VALVE                TRICUSPID VALVE MV Area (PHT): 7.66 cm     TR Peak grad:   17.0 mmHg MV Area VTI:   2.38 cm     TR Vmax:        206.00 cm/s MV Peak grad:  5.3 mmHg MV Mean grad:  3.0 mmHg     SHUNTS MV Vmax:       1.15 m/s     Systemic VTI:  0.16 m MV Vmean:      75.6 cm/s    Systemic Diam: 2.10 cm MV Decel Time: 99 msec      Pulmonic VTI:  0.146 m MV E velocity: 69.40 cm/s MV A velocity: 122.00 cm/s MV E/A ratio:  0.57 Serafina Royals MD Electronically signed by Serafina Royals MD Signature Date/Time: 12/28/2021/9:27:32 AM    Final    CT Renal Stone Study  Result Date: 12/27/2021 CLINICAL DATA:  Flank pain.  Evaluate for kidney stone EXAM: CT ABDOMEN AND PELVIS WITHOUT CONTRAST TECHNIQUE: Multidetector CT imaging of the abdomen and pelvis was performed following the standard  protocol without IV contrast. RADIATION DOSE REDUCTION: This exam was performed according to the departmental dose-optimization program which includes automated exposure control, adjustment of the mA and/or kV according to patient size and/or use of iterative reconstruction technique. COMPARISON:  None. FINDINGS: Lower chest: Heart size is enlarged. No pleural effusion or edema. Small pericardial effusion noted. Hepatobiliary: No focal liver abnormality is seen. No gallstones, gallbladder wall thickening, or biliary dilatation. Pancreas: Unremarkable. No pancreatic ductal dilatation or surrounding inflammatory changes. Spleen: Normal in size without focal abnormality. Adrenals/Urinary Tract: Normal adrenal glands. There is a tiny stone within the right UVJ measuring 2-3 mm, image 80/2. This results in mild right hydronephrosis and hydroureter with perinephric fat stranding. Several punctate stones are identified within the upper pole the left kidney. No left-sided hydronephrosis. -Within the upper pole of the right kidney there is a 2.5 cm, 9.55 Hounsfield units, image 36/2. This is compatible with a benign cyst. -Arising off the lateral cortex of the left kidney is a 1.7 cm exophytic lesion measuring 29 Hounsfield units, image 39/2. This does not meet criteria for a simple cyst. Stomach/Bowel: Stomach is within normal limits. Appendix appears normal. No evidence of bowel wall thickening, distention, or inflammatory changes. Sigmoid diverticulosis without signs of acute diverticulitis. Vascular/Lymphatic: Aortic atherosclerosis. No signs of abdominopelvic adenopathy. Reproductive: Prostate is unremarkable. Other: Trace fluid noted within the dependent portion of the pelvis. Musculoskeletal: Spondylosis identified within the lumbar spine. No acute or suspicious osseous findings. IMPRESSION: 1. Right-sided hydronephrosis and hydroureter secondary to a 2-3 mm right UVJ calculus. 2. Nonobstructing left nephrolithiasis.  3. Indeterminate 1.7 cm exophytic lesion arising off the lateral cortex of the left kidney. This does not meet criteria for a simple cyst. When the patient is clinically stable and able to follow directions and hold their breath (preferably as an outpatient) further evaluation with dedicated abdominal MRI without and with contrast material should be considered. 4. Cardiomegaly with small pericardial effusion. 5. Sigmoid diverticulosis without signs of acute diverticulitis. 6. Aortic Atherosclerosis (ICD10-I70.0). Electronically Signed   By: Kerby Moors M.D.   On: 12/27/2021 06:47     Assessment and Recommendation  61 y.o. male with malignant hypertension chronic kidney disease stage IV with mild to moderate LV systolic dysfunction likely secondary  to uncontrolled hypertension over the years now status post renal stent placement and improved pain as well as hypertension control 1.  Continue current medication management with hydralazine to 100 mg 3 times per day 2.  Continue to avoid angiotensin receptor blocker and ACE inhibitor due to concerns of chronic kidney disease 3.  No further cardiac diagnostics necessary at this time 4.  Supportive care for kidney dysfunction and kidney pain.  Patient is continuing to be stable and okay for kidney biopsy or other intervention if necessary for further evaluation and treatment options of chronic kidney disease.  Signed, Serafina Royals M.D. FACC

## 2021-12-30 NOTE — Anesthesia Postprocedure Evaluation (Signed)
Anesthesia Post Note  Patient: Domonique Brouillard Staup  Procedure(s) Performed: CYSTOSCOPY/URETEROSCOPY/STENT PLACEMENT (Right)  Patient location during evaluation: PACU Anesthesia Type: General Level of consciousness: awake and alert Pain management: pain level controlled Vital Signs Assessment: post-procedure vital signs reviewed and stable Respiratory status: spontaneous breathing, nonlabored ventilation, respiratory function stable and patient connected to nasal cannula oxygen Cardiovascular status: blood pressure returned to baseline and stable Postop Assessment: no apparent nausea or vomiting Anesthetic complications: no   No notable events documented.   Last Vitals:  Vitals:   12/30/21 0533 12/30/21 0754  BP: (!) 135/91 132/85  Pulse: 71 73  Resp: 20 18  Temp: 36.8 C 36.7 C  SpO2: 97% 99%    Last Pain:  Vitals:   12/30/21 0900  TempSrc:   PainSc: 4                  Martha Clan

## 2021-12-30 NOTE — Progress Notes (Signed)
Patient ID: Ricky Simmons, male   DOB: 04-Nov-1961, 61 y.o.   MRN: [id number] [facility] PROGRESS NOTE  JARYD DREW AYT:[id number] DOB: 1961/09/15 DOA: 12/27/2021 PCP: Marinda Elk, MD  HPI/Subjective: Patient feeling better today.  No abdominal pain when I saw him this morning.  Urinating well.  No shortness of breath.  Happy that his creatinine improved today.  Objective: Vitals:   12/30/21 0533 12/30/21 0754  BP: (!) 135/91 132/85  Pulse: 71 73  Resp: 20 18  Temp: 98.2 F (36.8 C) 98 F (36.7 C)  SpO2: 97% 99%    Intake/Output Summary (Last 24 hours) at 12/30/2021 1353 Last data filed at 12/30/2021 0950 Gross per 24 hour  Intake 240 ml  Output 2410 ml  Net -2170 ml   Filed Weights   12/27/21 0351  Weight: 72.6 kg    ROS: Review of Systems  Respiratory:  Negative for shortness of breath.   Cardiovascular:  Negative for chest pain.  Gastrointestinal:  Negative for abdominal pain, nausea and vomiting.  Exam: Physical Exam HENT:     Head: Normocephalic.     Mouth/Throat:     Pharynx: No oropharyngeal exudate.  Eyes:     General: Lids are normal.     Conjunctiva/sclera: Conjunctivae normal.  Cardiovascular:     Rate and Rhythm: Normal rate and regular rhythm.     Heart sounds: Normal heart sounds, S1 normal and S2 normal.  Abdominal:     Palpations: Abdomen is soft.     Tenderness: There is no abdominal tenderness.  Musculoskeletal:     Right lower leg: No swelling.     Left lower leg: No swelling.  Skin:    General: Skin is warm.     Findings: No rash.  Neurological:     Mental Status: He is alert and oriented to person, place, and time.      Scheduled Meds:  amLODipine  10 mg Oral BH-q7a   carvedilol  25 mg Oral BID   feeding supplement  237 mL Oral TID BM   hydrALAZINE  100 mg Oral Q8H   influenza vac split quadrivalent PF  0.5 mL Intramuscular Tomorrow-1000   multivitamin with minerals  1 tablet Oral Daily   nicotine  14 mg  Transdermal Daily   pneumococcal 23 valent vaccine  0.5 mL Intramuscular Tomorrow-1000   tamsulosin  0.4 mg Oral QPC supper     Assessment/Plan:  Acute kidney injury on chronic kidney disease stage IIIb.  Baseline creatinine around 1.85.  Creatinine peaked at 5.49 yesterday and has come down to 4.93 today.  Nephrology following.  Serology test pending. Hyperkalemia yesterday received Lokelma twice yesterday.  Today's potassium 4.4.  Continue to monitor.  Will discontinue Lokelma. Right distal ureteral calculus with right-sided hydronephrosis and hydroureter.  Dr. Diamantina Providence did a cystoscopy on 12/28/2021 and had stone extraction and stent placement.  Will need follow-up for stent removal as outpatient.  Continue Flomax and as needed Ditropan Accelerated hypertension.  Much improved today.  Continue Coreg amlodipine and hydralazine. Impending CHF with elevated BNP and cardiomyopathy with an EF of 30 to 35%.  Likely underlying chronic systolic congestive heart failure.  Currently lungs are clear.  Continue to monitor closely. 1.7 cm indeterminate lesion of the left kidney.  Will need follow-up MRI as outpatient.        Code Status:     Code Status Orders  (From admission, onward)  Start     Ordered   12/27/21 0832  Full code  Continuous        12/27/21 0833           Code Status History     This patient has a current code status but no historical code status.      Family Communication: Left message for patient's wife Disposition Plan: Status is: Inpatient  Creatinine peaked yesterday and starting to trend better.  Creatinine is 4.93 today would like to see the creatinine trending better prior to disposition.  Likely will need a few days here in the hospital.  Loletha Grayer  [facility]

## 2021-12-31 ENCOUNTER — Encounter: Payer: Self-pay | Admitting: Urology

## 2021-12-31 DIAGNOSIS — R809 Proteinuria, unspecified: Secondary | ICD-10-CM

## 2021-12-31 LAB — PROTEIN ELECTROPHORESIS, SERUM
A/G Ratio: 0.9 (ref 0.7–1.7)
Albumin ELP: 2.8 g/dL — ABNORMAL LOW (ref 2.9–4.4)
Alpha-1-Globulin: 0.4 g/dL (ref 0.0–0.4)
Alpha-2-Globulin: 0.9 g/dL (ref 0.4–1.0)
Beta Globulin: 0.9 g/dL (ref 0.7–1.3)
Gamma Globulin: 1 g/dL (ref 0.4–1.8)
Globulin, Total: 3.1 g/dL (ref 2.2–3.9)
Total Protein ELP: 5.9 g/dL — ABNORMAL LOW (ref 6.0–8.5)

## 2021-12-31 LAB — BASIC METABOLIC PANEL
Anion gap: 6 (ref 5–15)
BUN: 64 mg/dL — ABNORMAL HIGH (ref 6–20)
CO2: 23 mmol/L (ref 22–32)
Calcium: 8.1 mg/dL — ABNORMAL LOW (ref 8.9–10.3)
Chloride: 105 mmol/L (ref 98–111)
Creatinine, Ser: 4.56 mg/dL — ABNORMAL HIGH (ref 0.61–1.24)
GFR, Estimated: 14 mL/min — ABNORMAL LOW (ref >60)
Glucose, Bld: 102 mg/dL — ABNORMAL HIGH (ref 70–99)
Potassium: 4.9 mmol/L (ref 3.5–5.1)
Sodium: 134 mmol/L — ABNORMAL LOW (ref 135–145)

## 2021-12-31 LAB — GLOMERULAR BASEMENT MEMBRANE ANTIBODIES: GBM Ab: <0.2 units (ref 0.0–0.9)

## 2021-12-31 LAB — C3 COMPLEMENT: C3 Complement: 116 mg/dL (ref 82–167)

## 2021-12-31 LAB — ANA W/REFLEX IF POSITIVE: Anti Nuclear Antibody (ANA): NEGATIVE

## 2021-12-31 LAB — C4 COMPLEMENT: Complement C4, Body Fluid: 26 mg/dL (ref 12–38)

## 2021-12-31 LAB — KAPPA/LAMBDA LIGHT CHAINS
Kappa free light chain: 128.6 mg/L — ABNORMAL HIGH (ref 3.3–19.4)
Kappa, lambda light chain ratio: 2.02 — ABNORMAL HIGH (ref 0.26–1.65)
Lambda free light chains: 63.7 mg/L — ABNORMAL HIGH (ref 5.7–26.3)

## 2021-12-31 NOTE — Progress Notes (Signed)
Patient ID: Ricky Simmons, male   DOB: 05-Feb-1961, 61 y.o.   MRN: [id number] [facility] PROGRESS NOTE  Ricky Simmons JME:[id number] DOB: 03-Sep-1961 DOA: 12/27/2021 PCP: Marinda Elk, MD  HPI/Subjective: Patient feeling well.  No abdominal pain.  No shortness of breath.  Today's creatinine down to 4.56.  Came in with obstructing right distal ureteral calculus with hydronephrosis and hydroureter.  Objective: Vitals:   12/31/21 0410 12/31/21 0716  BP: 138/86 (!) 143/92  Pulse: 72 74  Resp: 20 18  Temp: 98.3 F (36.8 C) 98.7 F (37.1 C)  SpO2: 96% 97%    Intake/Output Summary (Last 24 hours) at 12/31/2021 1445 Last data filed at 12/31/2021 1420 Gross per 24 hour  Intake 540 ml  Output --  Net 540 ml   Filed Weights   12/27/21 0351  Weight: 72.6 kg    ROS: Review of Systems  Respiratory:  Negative for shortness of breath.   Cardiovascular:  Negative for chest pain.  Gastrointestinal:  Negative for abdominal pain, nausea and vomiting.  Exam: Physical Exam HENT:     Head: Normocephalic.     Mouth/Throat:     Pharynx: No oropharyngeal exudate.  Eyes:     General: Lids are normal.     Conjunctiva/sclera: Conjunctivae normal.  Cardiovascular:     Rate and Rhythm: Normal rate and regular rhythm.     Heart sounds: Normal heart sounds, S1 normal and S2 normal.  Pulmonary:     Breath sounds: No decreased breath sounds, wheezing, rhonchi or rales.  Abdominal:     Palpations: Abdomen is soft.     Tenderness: There is no abdominal tenderness.  Musculoskeletal:     Right ankle: No swelling.     Left ankle: No swelling.  Skin:    General: Skin is warm.     Findings: No rash.  Neurological:     Mental Status: He is alert and oriented to person, place, and time.      Scheduled Meds:  amLODipine  10 mg Oral BH-q7a   carvedilol  25 mg Oral BID   feeding supplement  237 mL Oral TID BM   hydrALAZINE  100 mg Oral Q8H   influenza vac split quadrivalent PF   0.5 mL Intramuscular Tomorrow-1000   multivitamin with minerals  1 tablet Oral Daily   nicotine  14 mg Transdermal Daily   pneumococcal 23 valent vaccine  0.5 mL Intramuscular Tomorrow-1000   tamsulosin  0.4 mg Oral QPC supper     Assessment/Plan:  Acute kidney injury on chronic kidney disease stage IIIb.  Nephrotic range proteinuria. baseline creatinine around 1.85.  Creatinine peaked at 5.49 and has come down to 4.56 today.  Serology pending.  Nephrology following and would like to do a kidney biopsy because the patient has nephrotic range proteinuria. Hyperkalemia improved holding Lokelma at this point. Right distal ureteral calculus with right-sided hydronephrosis and hydroureter.  Dr. Diamantina Providence did a cystoscopy on 12/28/2021 and had stone extraction and stent placement.  Will need outpatient stent removal.  Continue Flomax and as needed Ditropan. Accelerated hypertension on Coreg amlodipine and hydralazine. Impending CHF with elevated BNP and cardiomyopathy with an EF of 30 to 35%.  Chronic systolic congestive heart failure.  Currently lungs are clear.  Continue to monitor closely. 1.70 cm indeterminate lesion of the left kidney.  Will need follow-up MRI as outpatient.       Code Status:     Code Status Orders  (From admission, onward)  Start     Ordered   12/27/21 0832  Full code  Continuous        12/27/21 0833           Code Status History     This patient has a current code status but no historical code status.      Family Communication: Updated patient's wife on the phone Disposition Plan: Status is: Inpatient  With nephrotic range proteinuria, nephrology would like to do a kidney biopsy tomorrow.  Patient's creatinine will have to be better prior to disposition.  Likely will need a few more days here in the hospital.  Loletha Grayer  [facility]

## 2021-12-31 NOTE — Progress Notes (Signed)
[facility]  ROUNDING NOTE   Subjective:   CULLAN LAUNER is a 61 y.o. male with a past medical history of hypertension, nephrolithiasis, and chronic kidney disease stage III. He presents to the ED with complaints of worsening pain on the right side. He has been admitted for Kidney stone [Z61.0] Renal colic on right side [R60] AKI (acute kidney injury) ([place]) [N17.9] Hypertensive emergency [I16.1]  Patient found resting in bed, in no acute distress. He underwent cystoscopy,right ureteroscopy with basket extraction of distal ureteral stone and stent placement on 12/28/2020 Patient states he overall feels better compared to yesterday Serum creatinine has improved some as noted below Able to eat without nausea or vomiting No shortness of breath or leg edema 24-hour urine collection= > 4gm  Objective:  Vital signs in last 24 hours:  Temp:  [98.3 F (36.8 C)-98.7 F (37.1 C)] 98.7 F (37.1 C) (01/23 0716) Pulse Rate:  [72-74] 74 (01/23 0716) Resp:  [16-20] 18 (01/23 0716) BP: (136-143)/(86-92) 143/92 (01/23 0716) SpO2:  [96 %-98 %] 97 % (01/23 0716)  Weight change:  Filed Weights   12/27/21 0351  Weight: 72.6 kg    Intake/Output: I/O last 3 completed shifts: In: 480 [P.O.:480] Out: 2140 [Urine:2140]   Intake/Output this shift:  Total I/O In: 540 [P.O.:540] Out: -   Physical Exam: General: In no acute distress  Head: Normocephalic, atraumatic. Moist oral mucosal membranes  Eyes: Anicteric  Lungs:  Respirations even,unlabored, lungs clear  Heart: S1S2, no rubs or gallops  Abdomen:  Soft, non-tender,non distended  Extremities:  No peripheral edema.  Neurologic: Awake,alert,oriented  Skin: No lesions or rashes       Basic Metabolic Panel: Recent Labs  Lab 12/27/21 0404 12/28/21 0440 12/29/21 0512 12/30/21 0817 12/31/21 0748  NA 134* 135 130* 136 134*  K 4.7 4.8 5.3* 4.4 4.9  CL 109 110 105 104 105  CO2 19* 18* 18* 21* 23  GLUCOSE 119* 99 213*  92 102*  BUN 49* 54* 62* 70* 64*  CREATININE 4.64* 5.16* 5.49* 4.93* 4.56*  CALCIUM 8.6* 8.3* 8.1* 8.3* 8.1*     Liver Function Tests: Recent Labs  Lab 12/27/21 0404  AST 19  ALT 19  ALKPHOS 52  BILITOT 0.6  PROT 6.6  ALBUMIN 3.1*    Recent Labs  Lab 12/27/21 0404  LIPASE 43    No results for input(s): AMMONIA in the last 168 hours.  CBC: Recent Labs  Lab 12/27/21 0404 12/28/21 0440 12/29/21 0512  WBC 12.6* 9.5 6.8  HGB 11.5* 10.9* 10.6*  HCT 35.2* 33.3* 32.4*  MCV 93.4 92.0 92.0  PLT 216 177 185     Cardiac Enzymes: No results for input(s): CKTOTAL, CKMB, CKMBINDEX, TROPONINI in the last 168 hours.  BNP: Invalid input(s): POCBNP  CBG: No results for input(s): GLUCAP in the last 168 hours.  Microbiology: Results for orders placed or performed during the hospital encounter of 12/27/21  Urine Culture     Status: None   Collection Time: 12/27/21  5:50 AM   Specimen: Urine, Clean Catch  Result Value Ref Range Status   Specimen Description   Final    URINE, CLEAN CATCH Performed at [facility], [address] [place], [place] [address]    Special Requests   Final    NONE Performed at [facility], [address] [place], [place] [address]    Culture   Final    NO GROWTH Performed at [facility],  [address] [place], [place] [address]    Report Status 12/28/2021 FINAL  Final  Resp Panel by RT-PCR (Flu A&B, Covid) Nasopharyngeal Swab     Status: None   Collection Time: 12/27/21  7:17 AM   Specimen: Nasopharyngeal Swab; Nasopharyngeal(NP) swabs in vial transport medium  Result Value Ref Range Status   SARS Coronavirus 2 by RT PCR NEGATIVE NEGATIVE Final    Comment: (NOTE) SARS-CoV-2 target nucleic acids are NOT DETECTED.  The SARS-CoV-2 RNA is generally detectable in upper respiratory specimens during the acute phase of infection. The lowest concentration of SARS-CoV-2 viral copies this assay can detect is 138  copies/mL. A negative result does not preclude SARS-Cov-2 infection and should not be used as the sole basis for treatment or other patient management decisions. A negative result may occur with  improper specimen collection/handling, submission of specimen other than nasopharyngeal swab, presence of viral mutation(s) within the areas targeted by this assay, and inadequate number of viral copies(<138 copies/mL). A negative result must be combined with clinical observations, patient history, and epidemiological information. The expected result is Negative.  Fact Sheet for Patients:  EntrepreneurPulse.com.au  Fact Sheet for Healthcare Providers:  IncredibleEmployment.be  This test is no t yet approved or cleared by the [place] FDA and  has been authorized for detection and/or diagnosis of SARS-CoV-2 by FDA under an Emergency Use Authorization (EUA). This EUA will remain  in effect (meaning this test can be used) for the duration of the COVID-19 declaration under Section 564(b)(1) of the Act, 21 U.S.C.section 360bbb-3(b)(1), unless the authorization is terminated  or revoked sooner.       Influenza A by PCR NEGATIVE NEGATIVE Final   Influenza B by PCR NEGATIVE NEGATIVE Final    Comment: (NOTE) The Xpert Xpress SARS-CoV-2/FLU/RSV plus assay is intended as an aid in the diagnosis of influenza from Nasopharyngeal swab specimens and should not be used as a sole basis for treatment. Nasal washings and aspirates are unacceptable for Xpert Xpress SARS-CoV-2/FLU/RSV testing.  Fact Sheet for Patients: EntrepreneurPulse.com.au  Fact Sheet for Healthcare Providers: IncredibleEmployment.be  This test is not yet approved or cleared by the [place] FDA and has been authorized for detection and/or diagnosis of SARS-CoV-2 by FDA under an Emergency Use Authorization (EUA). This EUA will remain in effect (meaning  this test can be used) for the duration of the COVID-19 declaration under Section 564(b)(1) of the Act, 21 U.S.C. section 360bbb-3(b)(1), unless the authorization is terminated or revoked.  Performed at [facility], [address] [place], [place] [address]     Coagulation Studies: No results for input(s): LABPROT, INR in the last 72 hours.  Urinalysis: No results for input(s): COLORURINE, LABSPEC, PHURINE, GLUCOSEU, HGBUR, BILIRUBINUR, KETONESUR, PROTEINUR, UROBILINOGEN, NITRITE, LEUKOCYTESUR in the last 72 hours.  Invalid input(s): APPERANCEUR     Imaging: No results found.   Medications:     amLODipine  10 mg Oral BH-q7a   carvedilol  25 mg Oral BID   feeding supplement  237 mL Oral TID BM   hydrALAZINE  100 mg Oral Q8H   influenza vac split quadrivalent PF  0.5 mL Intramuscular Tomorrow-1000   multivitamin with minerals  1 tablet Oral Daily   nicotine  14 mg Transdermal Daily   pneumococcal 23 valent vaccine  0.5 mL Intramuscular Tomorrow-1000   tamsulosin  0.4 mg Oral QPC supper   acetaminophen **OR** acetaminophen, acetaminophen, hydrALAZINE, HYDROmorphone (DILAUDID) injection, ondansetron **OR** ondansetron (ZOFRAN) IV, oxybutynin, oxyCODONE  Assessment/  Plan:  Mr. BREYLON SHERROW is a 61 y.o.  male with a past medical history of hypertension, nephrolithiasis, and chronic kidney disease stage III. He presents to the ED with complaints of worsening pain on the right side. He has been admitted for Kidney stone [Q59.5] Renal colic on right side [G38] AKI (acute kidney injury) ([place]) [N17.9] Hypertensive emergency [I16.1]   Acute Kidney Injury on chronic kidney disease stage IIIb with proteinuria - baseline creatinine 1.85 and GFR of 40.  Acute kidney injury secondary to hydronephrosis from renal stone Chronic kidney disease is secondary to hypertension Work-up for proteinuria is in progress.  24-hour urine collection for total protein is > 4 gm  suggesting nephrotic range proteinuria;  Discussed with patient regarding kidney biopsy for definitive diagnosis;  Discussed risks and benefits and lifting restrictions post procedure of less than 10 lbs for 2 weeks; S Creatinine trends are improving   Lab Results  Component Value Date   CREATININE 4.56 (H) 12/31/2021   CREATININE 4.93 (H) 12/30/2021   CREATININE 5.49 (H) 12/29/2021    Intake/Output Summary (Last 24 hours) at 12/31/2021 1541 Last data filed at 12/31/2021 1420 Gross per 24 hour  Intake 540 ml  Output --  Net 540 ml    # Renal stone and rt hydronephrosis He underwent cystoscopy,right ureteroscopy with basket extraction of distal ureteral stone and stent placement on 12/28/2020.  # Left kidney exophytic lesion left kidney Will need outpatient follow up  2. Hypertension with chronic kidney disease.  currently getting amlodipine, carvedilol, hydralazine.     LOS: 4 Stephene Alegria 1/23/20233:41 PM

## 2021-12-31 NOTE — Consult Note (Signed)
Chief Complaint: Patient was seen in consultation today for CKD and proteinuria at the request of Murlean Iba, MD  Referring Physician(s): Murlean Iba, MD  Supervising Physician: Arne Cleveland  Patient Status: [place] - In-pt  History of Present Illness: Ricky Simmons is a 61 y.o. male with PMHx significant for HTN- uncontrolled, acute on CKD and nephrolithiasis. Patient presented to ED with complaints of right flank pain and found to have right sided hydronephrosis with hydroureter with right UVJ calculus, left nephrolithiasis and exophytic lesion off lateral cortex of left kidney. Patient is s/p cystoscopy with stone extraction and right ureteral stent placement 12/28/21. Nephrology did 24 hour urine with proteinuria and request for image guided non-target renal biopsy. The patient denies any current chest pain or shortness of breath. He does admit to 8/10 right flank pain that is controlled with pain medicine and has improved since stent. He denies any current blood thinner use, denies any known bleeding or clotting disorder. The patient denies any history of sleep apnea or chronic oxygen use. He has no known complications to sedation.   Past Medical History:  Diagnosis Date   Chronic kidney disease    STAGE 3-FOLLOWED BY PCP   GERD (gastroesophageal reflux disease)    RARE   History of kidney stones    H/O   Hypertension    Penile fracture 1989    Past Surgical History:  Procedure Laterality Date   COLONOSCOPY     CYSTOSCOPY/URETEROSCOPY/HOLMIUM LASER/STENT PLACEMENT Right 12/28/2021   Procedure: CYSTOSCOPY/URETEROSCOPY/STENT PLACEMENT;  Surgeon: Billey Co, MD;  Location: [facility] ORS;  Service: Urology;  Laterality: Right;   HEMORRHOID SURGERY N/A 05/07/2018   Procedure: HEMORRHOIDECTOMY;  Surgeon: Herbert Pun, MD;  Location: [facility] ORS;  Service: General;  Laterality: N/A;   TONSILLECTOMY     AGE 61    Allergies: Patient has no known  allergies.  Medications: Prior to Admission medications   Medication Sig Start Date End Date Taking? Authorizing Provider  acetaminophen (TYLENOL) 500 MG tablet Take 1,000 mg by mouth every 6 (six) hours as needed (for pain/headaches.).   Yes [provider]  amLODipine (NORVASC) 10 MG tablet Take 10 mg by mouth every morning.  03/02/18  Yes [provider]  carvedilol (COREG) 25 MG tablet Take 25 mg by mouth 2 (two) times daily. 03/02/18  Yes [provider]  hydrALAZINE (APRESOLINE) 50 MG tablet Take 50 mg by mouth 2 (two) times daily. 03/03/18  Yes [provider]  losartan (COZAAR) 100 MG tablet Take 100 mg by mouth every morning.  03/02/18  Yes [provider]     Family History  Problem Relation Age of Onset   Hypertension Mother     Social History   Socioeconomic History   Marital status: Married    Spouse name: Not on file   Number of children: Not on file   Years of education: Not on file   Highest education level: Not on file  Occupational History   Not on file  Tobacco Use   Smoking status: Every Day    Types: Cigarettes   Smokeless tobacco: Never   Tobacco comments:    3/4 daily  Vaping Use   Vaping Use: Never used  Substance and Sexual Activity   Alcohol use: Never   Drug use: Never   Sexual activity: Not on file  Other Topics Concern   Not on file  Social History Narrative   Not on file   Social Determinants of Health  Financial Resource Strain: Not on file  Food Insecurity: Not on file  Transportation Needs: Not on file  Physical Activity: Not on file  Stress: Not on file  Social Connections: Not on file   Review of Systems: A 12 point ROS discussed and pertinent positives are indicated in the HPI above.  All other systems are negative.  Review of Systems  Vital Signs: BP (!) 143/92 (BP Location: Left Arm)    Pulse 74    Temp 98.7 F (37.1 C) (Oral)    Resp 18    Ht 6' 2.5" (1.892 m)    Wt 160 lb (72.6  kg)    SpO2 97%    BMI 20.27 kg/m   Physical Exam Constitutional:      Appearance: He is well-developed.  HENT:     Head: Normocephalic and atraumatic.  Cardiovascular:     Rate and Rhythm: Normal rate and regular rhythm.  Pulmonary:     Effort: Pulmonary effort is normal. No respiratory distress.  Skin:    General: Skin is warm and dry.  Neurological:     Mental Status: He is alert and oriented to person, place, and time.    Imaging: DG Chest 2 View  Result Date: 12/27/2021 CLINICAL DATA:  61 year old male with shortness of breath. Right flank pain and hematuria. EXAM: CHEST - 2 VIEW COMPARISON:  None. FINDINGS: Cardiomegaly. Mildly tortuous thoracic aorta. Other mediastinal contours are within normal limits. Visualized tracheal air column is within normal limits. Normal lung volumes. No pneumothorax, pulmonary edema, pleural effusion or confluent pulmonary opacity. No acute osseous abnormality identified. Negative visible bowel gas. IMPRESSION: Cardiomegaly.  No acute cardiopulmonary abnormality. Electronically Signed   By: Genevie Ann M.D.   On: 12/27/2021 04:33   DG OR UROLOGY CYSTO IMAGE ([facility] ONLY)  Result Date: 12/28/2021 There is no interpretation for this exam.  This order is for images obtained during a surgical procedure.  Please See "Surgeries" Tab for more information regarding the procedure.   ECHOCARDIOGRAM COMPLETE  Result Date: 12/28/2021    ECHOCARDIOGRAM REPORT   Patient Name:   Ricky Simmons Date of Exam: 12/28/2021 Medical Rec #:  [id number]       Height:       74.5 in Accession #:    [id number]      Weight:       160.0 lb Date of Birth:  June 20, 1961       BSA:          1.986 m Patient Age:    61 years        BP:           171/91 mmHg Patient Gender: M               HR:           84 bpm. Exam Location:  [facility] Procedure: 2D Echo, Cardiac Doppler and Color Doppler Indications:     CHF-acute diastolic D66.44  History:         Patient has no prior history of Echocardiogram  examinations.                  Risk Factors:Hypertension. CKD.  Sonographer:     Sherrie Sport Referring Phys:  [place] Diagnosing Phys: Serafina Royals MD IMPRESSIONS  1. Left ventricular ejection fraction, by estimation, is 30 to 35%. The left ventricle has moderately decreased function. The left ventricle demonstrates global hypokinesis. The left ventricular internal cavity size was mildly  dilated. There is moderate  left ventricular hypertrophy. Left ventricular diastolic parameters are consistent with Grade II diastolic dysfunction (pseudonormalization).  2. Right ventricular systolic function is normal. The right ventricular size is normal.  3. Left atrial size was mild to moderately dilated.  4. The mitral valve is normal in structure. Moderate mitral valve regurgitation.  5. The aortic valve is normal in structure. Aortic valve regurgitation is trivial. FINDINGS  Left Ventricle: Left ventricular ejection fraction, by estimation, is 30 to 35%. The left ventricle has moderately decreased function. The left ventricle demonstrates global hypokinesis. The left ventricular internal cavity size was mildly dilated. There is moderate left ventricular hypertrophy. Left ventricular diastolic parameters are consistent with Grade II diastolic dysfunction (pseudonormalization). Right Ventricle: The right ventricular size is normal. No increase in right ventricular wall thickness. Right ventricular systolic function is normal. Left Atrium: Left atrial size was mild to moderately dilated. Right Atrium: Right atrial size was normal in size. Pericardium: Trivial pericardial effusion is present. Mitral Valve: The mitral valve is normal in structure. Moderate mitral valve regurgitation. MV peak gradient, 5.3 mmHg. The mean mitral valve gradient is 3.0 mmHg. Tricuspid Valve: The tricuspid valve is normal in structure. Tricuspid valve regurgitation is mild. Aortic Valve: The aortic valve is normal in structure. Aortic  valve regurgitation is trivial. Aortic valve mean gradient measures 5.0 mmHg. Aortic valve peak gradient measures 8.8 mmHg. Aortic valve area, by VTI measures 2.27 cm. Pulmonic Valve: The pulmonic valve was normal in structure. Pulmonic valve regurgitation is trivial. Aorta: The aortic root and ascending aorta are structurally normal, with no evidence of dilitation. IAS/Shunts: No atrial level shunt detected by color flow Doppler.  LEFT VENTRICLE PLAX 2D LVIDd:         6.10 cm      Diastology LVIDs:         4.60 cm      LV e' medial:    3.92 cm/s LV PW:         1.40 cm      LV E/e' medial:  17.7 LV IVS:        1.20 cm      LV e' lateral:   4.24 cm/s LVOT diam:     2.10 cm      LV E/e' lateral: 16.4 LV SV:         56 LV SV Index:   28 LVOT Area:     3.46 cm  LV Volumes (MOD) LV vol d, MOD A2C: 163.0 ml LV vol d, MOD A4C: 147.0 ml LV vol s, MOD A2C: 93.7 ml LV vol s, MOD A4C: 76.0 ml LV SV MOD A2C:     69.3 ml LV SV MOD A4C:     147.0 ml LV SV MOD BP:      74.6 ml RIGHT VENTRICLE RV Basal diam:  3.00 cm RV S prime:     17.30 cm/s TAPSE (M-mode): 4.0 cm LEFT ATRIUM              Index        RIGHT ATRIUM           Index LA diam:        4.40 cm  2.22 cm/m   RA Area:     27.70 cm LA Vol (A2C):   121.0 ml 60.93 ml/m  RA Volume:   98.70 ml  49.70 ml/m LA Vol (A4C):   103.0 ml 51.86 ml/m LA Biplane Vol: 114.0 ml 57.40 ml/m  AORTIC VALVE                    PULMONIC VALVE AV Area (Vmax):    2.03 cm     PV Vmax:        0.66 m/s AV Area (Vmean):   2.14 cm     PV Vmean:       39.550 cm/s AV Area (VTI):     2.27 cm     PV VTI:         0.092 m AV Vmax:           148.00 cm/s  PV Peak grad:   1.8 mmHg AV Vmean:          99.400 cm/s  PV Mean grad:   1.0 mmHg AV VTI:            0.249 m      RVOT Peak grad: 3 mmHg AV Peak Grad:      8.8 mmHg AV Mean Grad:      5.0 mmHg LVOT Vmax:         86.80 cm/s LVOT Vmean:        61.300 cm/s LVOT VTI:          0.163 m LVOT/AV VTI ratio: 0.65  AORTA Ao Root diam: 3.60 cm MITRAL VALVE                 TRICUSPID VALVE MV Area (PHT): 7.66 cm     TR Peak grad:   17.0 mmHg MV Area VTI:   2.38 cm     TR Vmax:        206.00 cm/s MV Peak grad:  5.3 mmHg MV Mean grad:  3.0 mmHg     SHUNTS MV Vmax:       1.15 m/s     Systemic VTI:  0.16 m MV Vmean:      75.6 cm/s    Systemic Diam: 2.10 cm MV Decel Time: 99 msec      Pulmonic VTI:  0.146 m MV E velocity: 69.40 cm/s MV A velocity: 122.00 cm/s MV E/A ratio:  0.57 Serafina Royals MD Electronically signed by Serafina Royals MD Signature Date/Time: 12/28/2021/9:27:32 AM    Final    CT Renal Stone Study  Result Date: 12/27/2021 CLINICAL DATA:  Flank pain.  Evaluate for kidney stone EXAM: CT ABDOMEN AND PELVIS WITHOUT CONTRAST TECHNIQUE: Multidetector CT imaging of the abdomen and pelvis was performed following the standard protocol without IV contrast. RADIATION DOSE REDUCTION: This exam was performed according to the departmental dose-optimization program which includes automated exposure control, adjustment of the mA and/or kV according to patient size and/or use of iterative reconstruction technique. COMPARISON:  None. FINDINGS: Lower chest: Heart size is enlarged. No pleural effusion or edema. Small pericardial effusion noted. Hepatobiliary: No focal liver abnormality is seen. No gallstones, gallbladder wall thickening, or biliary dilatation. Pancreas: Unremarkable. No pancreatic ductal dilatation or surrounding inflammatory changes. Spleen: Normal in size without focal abnormality. Adrenals/Urinary Tract: Normal adrenal glands. There is a tiny stone within the right UVJ measuring 2-3 mm, image 80/2. This results in mild right hydronephrosis and hydroureter with perinephric fat stranding. Several punctate stones are identified within the upper pole the left kidney. No left-sided hydronephrosis. -Within the upper pole of the right kidney there is a 2.5 cm, 9.55 Hounsfield units, image 36/2. This is compatible with a benign cyst. -Arising off the lateral cortex of  the left kidney is a 1.7 cm  exophytic lesion measuring 29 Hounsfield units, image 39/2. This does not meet criteria for a simple cyst. Stomach/Bowel: Stomach is within normal limits. Appendix appears normal. No evidence of bowel wall thickening, distention, or inflammatory changes. Sigmoid diverticulosis without signs of acute diverticulitis. Vascular/Lymphatic: Aortic atherosclerosis. No signs of abdominopelvic adenopathy. Reproductive: Prostate is unremarkable. Other: Trace fluid noted within the dependent portion of the pelvis. Musculoskeletal: Spondylosis identified within the lumbar spine. No acute or suspicious osseous findings. IMPRESSION: 1. Right-sided hydronephrosis and hydroureter secondary to a 2-3 mm right UVJ calculus. 2. Nonobstructing left nephrolithiasis. 3. Indeterminate 1.7 cm exophytic lesion arising off the lateral cortex of the left kidney. This does not meet criteria for a simple cyst. When the patient is clinically stable and able to follow directions and hold their breath (preferably as an outpatient) further evaluation with dedicated abdominal MRI without and with contrast material should be considered. 4. Cardiomegaly with small pericardial effusion. 5. Sigmoid diverticulosis without signs of acute diverticulitis. 6. Aortic Atherosclerosis (ICD10-I70.0). Electronically Signed   By: Kerby Moors M.D.   On: 12/27/2021 06:47    Labs:  CBC: Recent Labs    12/27/21 0404 12/28/21 0440 12/29/21 0512  WBC 12.6* 9.5 6.8  HGB 11.5* 10.9* 10.6*  HCT 35.2* 33.3* 32.4*  PLT 216 177 185    COAGS: No results for input(s): INR, APTT in the last 8760 hours.  BMP: Recent Labs    12/28/21 0440 12/29/21 0512 12/30/21 0817 12/31/21 0748  NA 135 130* 136 134*  K 4.8 5.3* 4.4 4.9  CL 110 105 104 105  CO2 18* 18* 21* 23  GLUCOSE 99 213* 92 102*  BUN 54* 62* 70* 64*  CALCIUM 8.3* 8.1* 8.3* 8.1*  CREATININE 5.16* 5.49* 4.93* 4.56*  GFRNONAA 12* 11* 13* 14*    LIVER FUNCTION  TESTS: Recent Labs    12/27/21 0404  BILITOT 0.6  AST 19  ALT 19  ALKPHOS 52  PROT 6.6  ALBUMIN 3.1*    Assessment and Plan: 62 year old male with PMHx significant for HTN- uncontrolled, acute on CKD and nephrolithiasis. Patient presented to ED with complaints of right flank pain and found to have right sided hydronephrosis with hydroureter with right UVJ calculus, left nephrolithiasis and exophytic lesion off lateral cortex of left kidney. Patient is s/p cystoscopy with stone extraction and right ureteral stent placement 12/28/21. Nephrology did 24 hour urine with proteinuria and request for image guided non-target renal biopsy.   The patient will be NPO after midnight, no blood thinners taken, imaging, labs and vitals have been reviewed.  Risks and benefits of image guided non target renal biopsy with moderate sedation was discussed with the patient and/or patient's family including, but not limited to bleeding, infection, damage to adjacent structures or low yield requiring additional tests.  All of the questions were answered and there is agreement to proceed.  Consent signed and in chart.   Thank you for this interesting consult.  I greatly enjoyed meeting ESTEN DOLLAR and look forward to participating in their care.  A copy of this report was sent to the requesting provider on this date.  Electronically Signed: Hedy Jacob, PA-C 12/31/2021, 2:57 PM   I spent a total of 20 Minutes in face to face in clinical consultation, greater than 50% of which was counseling/coordinating care for chronic kidney disease with proteinuria.

## 2021-12-31 NOTE — Progress Notes (Signed)
[facility] Encounter Note  Patient: Ricky Simmons / Admit Date: 12/27/2021 / Date of Encounter: 12/31/2021, 8:53 AM   Subjective: Overall procedure on 12/28/2021 went well with no evidence of complication.  Patient with less kidney pain.  Patient has had muich improved blood pressure management over the last 24 hours with the use of hydralazine, carvedilol, amlodipine. Currently patient has no complaints and denies any problems with SOB, lower extremity swelling, chest pain. Creatinine slightly improved from yesterday.   Echocardiogram showing mild to moderate LV systolic dysfunction with severe left ventricular hypertrophy likely secondary to significant hypertension over the years.  Patient still has a significant chronic kidney disease stage IV which complicates the concerns for treatment with hypertension.  Glomerular filtration rate appears to be significantly abnormal  Review of Systems: Positive for: Mild pain Negative for: Vision change, hearing change, syncope, dizziness, nausea, vomiting,diarrhea, bloody stool, stomach pain, cough, congestion, diaphoresis, urinary frequency, urinary pain,skin lesions, skin rashes Others previously listed  Objective: Telemetry: Normal sinus rhythm Physical Exam: Blood pressure (!) 143/92, pulse 74, temperature 98.7 F (37.1 C), temperature source Oral, resp. rate 18, height 6' 2.5" (1.892 m), weight 72.6 kg, SpO2 97 %. Body mass index is 20.27 kg/m. General: Well developed, well nourished, in no acute distress. Head: Normocephalic, atraumatic, sclera non-icteric, no xanthomas, nares are without discharge. Neck: No apparent masses Lungs: Normal respirations with no wheezes, no rhonchi, no rales , no crackles   Heart: Regular rate and rhythm, normal S1 S2, no murmur, no rub, no gallop, PMI is normal size and placement, carotid upstroke normal without bruit, jugular venous pressure normal Abdomen: Soft, non-tender, non-distended  with normoactive bowel sounds. No hepatosplenomegaly. Abdominal aorta is normal size without bruit Extremities: No edema, no clubbing, no cyanosis, no ulcers,  Peripheral: 2+ radial, 2+ femoral, 2+ dorsal pedal pulses Neuro: Alert and oriented. Moves all extremities spontaneously. Psych:  Responds to questions appropriately with a normal affect.   Intake/Output Summary (Last 24 hours) at 12/31/2021 0853 Last data filed at 12/30/2021 1430 Gross per 24 hour  Intake 480 ml  Output 670 ml  Net -190 ml     Inpatient Medications:   amLODipine  10 mg Oral BH-q7a   carvedilol  25 mg Oral BID   feeding supplement  237 mL Oral TID BM   hydrALAZINE  100 mg Oral Q8H   influenza vac split quadrivalent PF  0.5 mL Intramuscular Tomorrow-1000   multivitamin with minerals  1 tablet Oral Daily   nicotine  14 mg Transdermal Daily   pneumococcal 23 valent vaccine  0.5 mL Intramuscular Tomorrow-1000   tamsulosin  0.4 mg Oral QPC supper   Infusions:   Labs: Recent Labs    12/30/21 0817 12/31/21 0748  NA 136 134*  K 4.4 4.9  CL 104 105  CO2 21* 23  GLUCOSE 92 102*  BUN 70* 64*  CREATININE 4.93* 4.56*  CALCIUM 8.3* 8.1*    No results for input(s): AST, ALT, ALKPHOS, BILITOT, PROT, ALBUMIN in the last 72 hours.  Recent Labs    12/29/21 0512  WBC 6.8  HGB 10.6*  HCT 32.4*  MCV 92.0  PLT 185    No results for input(s): CKTOTAL, CKMB, TROPONINI in the last 72 hours. Invalid input(s): POCBNP No results for input(s): HGBA1C in the last 72 hours.   Weights: Filed Weights   12/27/21 0351  Weight: 72.6 kg     Radiology/Studies:  DG Chest 2 View  Result Date: 12/27/2021  CLINICAL DATA:  61 year old male with shortness of breath. Right flank pain and hematuria. EXAM: CHEST - 2 VIEW COMPARISON:  None. FINDINGS: Cardiomegaly. Mildly tortuous thoracic aorta. Other mediastinal contours are within normal limits. Visualized tracheal air column is within normal limits. Normal lung  volumes. No pneumothorax, pulmonary edema, pleural effusion or confluent pulmonary opacity. No acute osseous abnormality identified. Negative visible bowel gas. IMPRESSION: Cardiomegaly.  No acute cardiopulmonary abnormality. Electronically Signed   By: Genevie Ann M.D.   On: 12/27/2021 04:33   DG OR UROLOGY CYSTO IMAGE ([facility] ONLY)  Result Date: 12/28/2021 There is no interpretation for this exam.  This order is for images obtained during a surgical procedure.  Please See "Surgeries" Tab for more information regarding the procedure.   ECHOCARDIOGRAM COMPLETE  Result Date: 12/28/2021    ECHOCARDIOGRAM REPORT   Patient Name:   Ricky Simmons Date of Exam: 12/28/2021 Medical Rec #:  [id number]       Height:       74.5 in Accession #:    [id number]      Weight:       160.0 lb Date of Birth:  Jan 21, 1961       BSA:          1.986 m Patient Age:    61 years        BP:           171/91 mmHg Patient Gender: M               HR:           84 bpm. Exam Location:  [facility] Procedure: 2D Echo, Cardiac Doppler and Color Doppler Indications:     CHF-acute diastolic Z32.99  History:         Patient has no prior history of Echocardiogram examinations.                  Risk Factors:Hypertension. CKD.  Sonographer:     Sherrie Sport Referring Phys:  [place] Diagnosing Phys: Serafina Royals MD IMPRESSIONS  1. Left ventricular ejection fraction, by estimation, is 30 to 35%. The left ventricle has moderately decreased function. The left ventricle demonstrates global hypokinesis. The left ventricular internal cavity size was mildly dilated. There is moderate  left ventricular hypertrophy. Left ventricular diastolic parameters are consistent with Grade II diastolic dysfunction (pseudonormalization).  2. Right ventricular systolic function is normal. The right ventricular size is normal.  3. Left atrial size was mild to moderately dilated.  4. The mitral valve is normal in structure. Moderate mitral valve regurgitation.  5. The  aortic valve is normal in structure. Aortic valve regurgitation is trivial. FINDINGS  Left Ventricle: Left ventricular ejection fraction, by estimation, is 30 to 35%. The left ventricle has moderately decreased function. The left ventricle demonstrates global hypokinesis. The left ventricular internal cavity size was mildly dilated. There is moderate left ventricular hypertrophy. Left ventricular diastolic parameters are consistent with Grade II diastolic dysfunction (pseudonormalization). Right Ventricle: The right ventricular size is normal. No increase in right ventricular wall thickness. Right ventricular systolic function is normal. Left Atrium: Left atrial size was mild to moderately dilated. Right Atrium: Right atrial size was normal in size. Pericardium: Trivial pericardial effusion is present. Mitral Valve: The mitral valve is normal in structure. Moderate mitral valve regurgitation. MV peak gradient, 5.3 mmHg. The mean mitral valve gradient is 3.0 mmHg. Tricuspid Valve: The tricuspid valve is normal in structure. Tricuspid valve regurgitation is mild. Aortic Valve:  The aortic valve is normal in structure. Aortic valve regurgitation is trivial. Aortic valve mean gradient measures 5.0 mmHg. Aortic valve peak gradient measures 8.8 mmHg. Aortic valve area, by VTI measures 2.27 cm. Pulmonic Valve: The pulmonic valve was normal in structure. Pulmonic valve regurgitation is trivial. Aorta: The aortic root and ascending aorta are structurally normal, with no evidence of dilitation. IAS/Shunts: No atrial level shunt detected by color flow Doppler.  LEFT VENTRICLE PLAX 2D LVIDd:         6.10 cm      Diastology LVIDs:         4.60 cm      LV e' medial:    3.92 cm/s LV PW:         1.40 cm      LV E/e' medial:  17.7 LV IVS:        1.20 cm      LV e' lateral:   4.24 cm/s LVOT diam:     2.10 cm      LV E/e' lateral: 16.4 LV SV:         56 LV SV Index:   28 LVOT Area:     3.46 cm  LV Volumes (MOD) LV vol d, MOD A2C:  163.0 ml LV vol d, MOD A4C: 147.0 ml LV vol s, MOD A2C: 93.7 ml LV vol s, MOD A4C: 76.0 ml LV SV MOD A2C:     69.3 ml LV SV MOD A4C:     147.0 ml LV SV MOD BP:      74.6 ml RIGHT VENTRICLE RV Basal diam:  3.00 cm RV S prime:     17.30 cm/s TAPSE (M-mode): 4.0 cm LEFT ATRIUM              Index        RIGHT ATRIUM           Index LA diam:        4.40 cm  2.22 cm/m   RA Area:     27.70 cm LA Vol (A2C):   121.0 ml 60.93 ml/m  RA Volume:   98.70 ml  49.70 ml/m LA Vol (A4C):   103.0 ml 51.86 ml/m LA Biplane Vol: 114.0 ml 57.40 ml/m  AORTIC VALVE                    PULMONIC VALVE AV Area (Vmax):    2.03 cm     PV Vmax:        0.66 m/s AV Area (Vmean):   2.14 cm     PV Vmean:       39.550 cm/s AV Area (VTI):     2.27 cm     PV VTI:         0.092 m AV Vmax:           148.00 cm/s  PV Peak grad:   1.8 mmHg AV Vmean:          99.400 cm/s  PV Mean grad:   1.0 mmHg AV VTI:            0.249 m      RVOT Peak grad: 3 mmHg AV Peak Grad:      8.8 mmHg AV Mean Grad:      5.0 mmHg LVOT Vmax:         86.80 cm/s LVOT Vmean:        61.300 cm/s LVOT VTI:  0.163 m LVOT/AV VTI ratio: 0.65  AORTA Ao Root diam: 3.60 cm MITRAL VALVE                TRICUSPID VALVE MV Area (PHT): 7.66 cm     TR Peak grad:   17.0 mmHg MV Area VTI:   2.38 cm     TR Vmax:        206.00 cm/s MV Peak grad:  5.3 mmHg MV Mean grad:  3.0 mmHg     SHUNTS MV Vmax:       1.15 m/s     Systemic VTI:  0.16 m MV Vmean:      75.6 cm/s    Systemic Diam: 2.10 cm MV Decel Time: 99 msec      Pulmonic VTI:  0.146 m MV E velocity: 69.40 cm/s MV A velocity: 122.00 cm/s MV E/A ratio:  0.57 Serafina Royals MD Electronically signed by Serafina Royals MD Signature Date/Time: 12/28/2021/9:27:32 AM    Final    CT Renal Stone Study  Result Date: 12/27/2021 CLINICAL DATA:  Flank pain.  Evaluate for kidney stone EXAM: CT ABDOMEN AND PELVIS WITHOUT CONTRAST TECHNIQUE: Multidetector CT imaging of the abdomen and pelvis was performed following the standard protocol without IV  contrast. RADIATION DOSE REDUCTION: This exam was performed according to the departmental dose-optimization program which includes automated exposure control, adjustment of the mA and/or kV according to patient size and/or use of iterative reconstruction technique. COMPARISON:  None. FINDINGS: Lower chest: Heart size is enlarged. No pleural effusion or edema. Small pericardial effusion noted. Hepatobiliary: No focal liver abnormality is seen. No gallstones, gallbladder wall thickening, or biliary dilatation. Pancreas: Unremarkable. No pancreatic ductal dilatation or surrounding inflammatory changes. Spleen: Normal in size without focal abnormality. Adrenals/Urinary Tract: Normal adrenal glands. There is a tiny stone within the right UVJ measuring 2-3 mm, image 80/2. This results in mild right hydronephrosis and hydroureter with perinephric fat stranding. Several punctate stones are identified within the upper pole the left kidney. No left-sided hydronephrosis. -Within the upper pole of the right kidney there is a 2.5 cm, 9.55 Hounsfield units, image 36/2. This is compatible with a benign cyst. -Arising off the lateral cortex of the left kidney is a 1.7 cm exophytic lesion measuring 29 Hounsfield units, image 39/2. This does not meet criteria for a simple cyst. Stomach/Bowel: Stomach is within normal limits. Appendix appears normal. No evidence of bowel wall thickening, distention, or inflammatory changes. Sigmoid diverticulosis without signs of acute diverticulitis. Vascular/Lymphatic: Aortic atherosclerosis. No signs of abdominopelvic adenopathy. Reproductive: Prostate is unremarkable. Other: Trace fluid noted within the dependent portion of the pelvis. Musculoskeletal: Spondylosis identified within the lumbar spine. No acute or suspicious osseous findings. IMPRESSION: 1. Right-sided hydronephrosis and hydroureter secondary to a 2-3 mm right UVJ calculus. 2. Nonobstructing left nephrolithiasis. 3. Indeterminate 1.7  cm exophytic lesion arising off the lateral cortex of the left kidney. This does not meet criteria for a simple cyst. When the patient is clinically stable and able to follow directions and hold their breath (preferably as an outpatient) further evaluation with dedicated abdominal MRI without and with contrast material should be considered. 4. Cardiomegaly with small pericardial effusion. 5. Sigmoid diverticulosis without signs of acute diverticulitis. 6. Aortic Atherosclerosis (ICD10-I70.0). Electronically Signed   By: Kerby Moors M.D.   On: 12/27/2021 06:47     Assessment and Recommendation  60 y.o. male with malignant hypertension chronic kidney disease stage IV with mild to moderate LV systolic dysfunction likely secondary  to uncontrolled hypertension over the years now status post renal stent placement and improved pain as well as hypertension control  Plan: 1.  Continue current medication management with hydralazine 100 mg 3 times per day, carvedilol 25 mg BID, amlodipine 10 mg daily for blood pressure management 2.  Continue to avoid angiotensin receptor blocker and ACE inhibitor due to concerns of chronic kidney disease 3.  No further cardiac diagnostics necessary at this time 4.  Supportive care for kidney dysfunction and kidney pain.  Patient is continuing to be stable and okay for kidney biopsy or other intervention if necessary for further evaluation and treatment options of chronic kidney disease.  Signed, Jettie Booze, PA-C

## 2022-01-01 ENCOUNTER — Inpatient Hospital Stay: Payer: Managed Care, Other (non HMO)

## 2022-01-01 LAB — PROTIME-INR
INR: 1 (ref 0.8–1.2)
Prothrombin Time: 12.9 seconds (ref 11.4–15.2)

## 2022-01-01 LAB — BASIC METABOLIC PANEL
Anion gap: 8 (ref 5–15)
BUN: 65 mg/dL — ABNORMAL HIGH (ref 6–20)
CO2: 22 mmol/L (ref 22–32)
Calcium: 8.4 mg/dL — ABNORMAL LOW (ref 8.9–10.3)
Chloride: 104 mmol/L (ref 98–111)
Creatinine, Ser: 4.33 mg/dL — ABNORMAL HIGH (ref 0.61–1.24)
GFR, Estimated: 15 mL/min — ABNORMAL LOW (ref >60)
Glucose, Bld: 108 mg/dL — ABNORMAL HIGH (ref 70–99)
Potassium: 4.3 mmol/L (ref 3.5–5.1)
Sodium: 134 mmol/L — ABNORMAL LOW (ref 135–145)

## 2022-01-01 MED ORDER — FENTANYL CITRATE (PF) 100 MCG/2ML IJ SOLN
INTRAMUSCULAR | Status: AC
  Filled 2022-01-01: qty 2

## 2022-01-01 MED ORDER — FENTANYL CITRATE (PF) 100 MCG/2ML IJ SOLN
INTRAMUSCULAR | Status: AC | PRN
  Administered 2022-01-01: 50 ug via INTRAVENOUS

## 2022-01-01 MED ORDER — MIDAZOLAM HCL 2 MG/2ML IJ SOLN
INTRAMUSCULAR | Status: AC | PRN
  Administered 2022-01-01: 1 mg via INTRAVENOUS

## 2022-01-01 MED ORDER — HYDRALAZINE HCL 20 MG/ML IJ SOLN
INTRAMUSCULAR | Status: AC
  Filled 2022-01-01: qty 1

## 2022-01-01 MED ORDER — MIDAZOLAM HCL 2 MG/2ML IJ SOLN
INTRAMUSCULAR | Status: AC
  Filled 2022-01-01: qty 2

## 2022-01-01 MED ORDER — LABETALOL HCL 5 MG/ML IV SOLN
INTRAVENOUS | Status: AC
  Filled 2022-01-01: qty 4

## 2022-01-01 NOTE — Progress Notes (Signed)
Patient ID: Ricky Simmons, male   DOB: 1961/02/11, 61 y.o.   MRN: [id number] [facility] PROGRESS NOTE  Ricky Simmons:[id number] DOB: 30-Oct-1961 DOA: 12/27/2021 PCP: Marinda Elk, MD  HPI/Subjective: Patient feels well and offers no complaints.  No pain from kidney biopsy.  I mentioned that I still see the nurses marking pain in the abdomen.  Advised patient to try Ditropan for spasms rather than pain medications.  Initially came in with abdominal pain and found to have obstructing kidney stone.  Objective: Vitals:   01/01/22 1000 01/01/22 1005  BP: 131/81 133/82  Pulse: 75 72  Resp: 18 16  Temp:    SpO2: 98% 96%    Intake/Output Summary (Last 24 hours) at 01/01/2022 1523 Last data filed at 01/01/2022 0900 Gross per 24 hour  Intake 480 ml  Output --  Net 480 ml   Filed Weights   12/27/21 0351  Weight: 72.6 kg    ROS: Review of Systems  Respiratory:  Negative for shortness of breath.   Cardiovascular:  Negative for chest pain.  Gastrointestinal:  Negative for abdominal pain, nausea and vomiting.  Exam: Physical Exam HENT:     Head: Normocephalic.     Mouth/Throat:     Pharynx: No oropharyngeal exudate.  Eyes:     General: Lids are normal.     Conjunctiva/sclera: Conjunctivae normal.     Pupils: Pupils are equal, round, and reactive to light.  Cardiovascular:     Rate and Rhythm: Normal rate and regular rhythm.     Heart sounds: Normal heart sounds, S1 normal and S2 normal.  Pulmonary:     Breath sounds: No decreased breath sounds, wheezing, rhonchi or rales.  Abdominal:     Palpations: Abdomen is soft.     Tenderness: There is no abdominal tenderness.  Musculoskeletal:     Right lower leg: No swelling.     Left lower leg: No swelling.  Skin:    General: Skin is warm.     Findings: No rash.  Neurological:     Mental Status: He is alert and oriented to person, place, and time.      Scheduled Meds:  amLODipine  10 mg Oral BH-q7a    carvedilol  25 mg Oral BID   feeding supplement  237 mL Oral TID BM   fentaNYL       hydrALAZINE       hydrALAZINE  100 mg Oral Q8H   influenza vac split quadrivalent PF  0.5 mL Intramuscular Tomorrow-1000   labetalol       midazolam       multivitamin with minerals  1 tablet Oral Daily   nicotine  14 mg Transdermal Daily   pneumococcal 23 valent vaccine  0.5 mL Intramuscular Tomorrow-1000   tamsulosin  0.4 mg Oral QPC supper   Brief history: 61 year old man with past medical history of chronic kidney disease stage IIIb, hypertension and GERD.  He presented to the hospital with abdominal pain and found to have a right distal ureteral calculus with right-sided hydronephrosis and hydroureter.  Dr. Diamantina Providence did a cystoscopy on 12/28/2021 and had stone extraction and stent placement.  The patient was found to have nephrotic range proteinuria and creatinine much more impaired than baseline.  A kidney biopsy was done on 01/01/2022.  Assessment/Plan:  Acute kidney injury on chronic kidney disease stage IIIb.  Nephrotic range proteinuria on 24-hour urine.  Baseline creatinine around 1.85.  Creatinine peaked at 5.49 and has come  down to 4.33 as of today.  Kidney biopsy on 01/01/2022.  Nephrology would like to check creatinine 1 more day and then likely can follow-up as outpatient. Right distal ureteral calculus with right-sided hydronephrosis and hydroureter.  Dr. Emogene Morgan ski did a cystoscopy on 12/28/2021 and did a stone extraction and stent placement.  Advised taking as needed Ditropan for the pain.  Continue Flomax.  Will need outpatient stent removal. Hyperkalemia during the hospital course with worsening kidney function.  Given a few days of Lokelma and this has improved. Accelerated hypertension likely secondary to pain.  Blood pressure much better controlled on Coreg amlodipine and hydralazine. Impending CHF with elevated BNP and cardiomyopathy with a EF of 30 to 35%.  Suspect underlying chronic systolic  congestive heart failure.  Lungs currently clear.  Continue to monitor closely.  With creatinine elevation no ACE or ARB or spironolactone or even Lasix at this point. 1.7 cm indeterminate lesion of the left kidney.  Will need outpatient MRI.        Code Status:     Code Status Orders  (From admission, onward)           Start     Ordered   12/27/21 0832  Full code  Continuous        12/27/21 0833           Code Status History     This patient has a current code status but no historical code status.      Family Communication: Spoke with wife at the bedside Disposition Plan: Status is: Inpatient  Patient had a kidney biopsy today.  Nephrology would like to watch creatinine another day and hopefully will be able to go home tomorrow.  Consultants: Nephrology Urology Cardiology  Procedures: Ureteral stent and stone extraction Kidney biopsy  [place]  [facility]

## 2022-01-01 NOTE — Procedures (Signed)
Interventional Radiology Procedure Note  Procedure: [place] left renal core bx    Complications: None  Estimated Blood Loss:  min  Findings: Cores on saline telfa     Tamera Punt, MD

## 2022-01-01 NOTE — Progress Notes (Signed)
[facility] Encounter Note  Patient: Ricky Simmons / Admit Date: 12/27/2021 / Date of Encounter: 01/01/2022, 10:59 AM   Subjective: Overall procedure on 12/28/2021 went well with no evidence of complication.  Patient with less kidney pain.  Patient has had much improved blood pressure management over the last 48 hours with the use of hydralazine, carvedilol, amlodipine. Currently patient has no complaints and denies any problems with SOB, lower extremity swelling, chest pain. Creatinine slightly improved from yesterday.   Echocardiogram showing mild to moderate LV systolic dysfunction with severe left ventricular hypertrophy likely secondary to significant hypertension over the years.  Patient still has a significant chronic kidney disease stage IV which complicates the concerns for treatment with hypertension.  Glomerular filtration rate appears to be significantly abnormal although slowly improving. Nephrology currently planning for a renal biopsy today  Review of Systems: Positive for: none Negative for: Vision change, hearing change, syncope, dizziness, nausea, vomiting,diarrhea, bloody stool, stomach pain, cough, congestion, diaphoresis, urinary frequency, urinary pain,skin lesions, skin rashes Others previously listed  Objective: Telemetry: Normal sinus rhythm Physical Exam: Blood pressure 133/82, pulse 72, temperature 98.3 F (36.8 C), temperature source Oral, resp. rate 16, height 6' 2.5" (1.892 m), weight 72.6 kg, SpO2 96 %. Body mass index is 20.27 kg/m. General: Well developed, well nourished, in no acute distress. Head: Normocephalic, atraumatic, sclera non-icteric, no xanthomas, nares are without discharge. Neck: No apparent masses Lungs: Normal respirations with no wheezes, no rhonchi, no rales , no crackles   Heart: Regular rate and rhythm, normal S1 S2, no murmur, no rub, no gallop, PMI is normal size and placement, carotid upstroke normal without bruit,  jugular venous pressure normal Abdomen: Soft, non-tender, non-distended with normoactive bowel sounds. No hepatosplenomegaly. Abdominal aorta is normal size without bruit Extremities: No edema, no clubbing, no cyanosis, no ulcers,  Peripheral: 2+ radial, 2+ femoral, 2+ dorsal pedal pulses Neuro: Alert and oriented. Moves all extremities spontaneously. Psych:  Responds to questions appropriately with a normal affect.   Intake/Output Summary (Last 24 hours) at 01/01/2022 1059 Last data filed at 01/01/2022 0900 Gross per 24 hour  Intake 660 ml  Output --  Net 660 ml     Inpatient Medications:   amLODipine  10 mg Oral [id number]   carvedilol  25 mg Oral BID   feeding supplement  237 mL Oral TID BM   fentaNYL       hydrALAZINE       hydrALAZINE  100 mg Oral Q8H   influenza vac split quadrivalent PF  0.5 mL Intramuscular Tomorrow-1000   labetalol       midazolam       multivitamin with minerals  1 tablet Oral Daily   nicotine  14 mg Transdermal Daily   pneumococcal 23 valent vaccine  0.5 mL Intramuscular Tomorrow-1000   tamsulosin  0.4 mg Oral QPC supper   Infusions:   Labs: Recent Labs    12/31/21 0748 01/01/22 0443  NA 134* 134*  K 4.9 4.3  CL 105 104  CO2 23 22  GLUCOSE 102* 108*  BUN 64* 65*  CREATININE 4.56* 4.33*  CALCIUM 8.1* 8.4*    No results for input(s): AST, ALT, ALKPHOS, BILITOT, PROT, ALBUMIN in the last 72 hours.  No results for input(s): WBC, NEUTROABS, HGB, HCT, MCV, PLT in the last 72 hours.  No results for input(s): CKTOTAL, CKMB, TROPONINI in the last 72 hours. Invalid input(s): POCBNP No results for input(s): HGBA1C in the last 72 hours.  Weights: Filed Weights   12/27/21 0351  Weight: 72.6 kg     Radiology/Studies:  DG Chest 2 View  Result Date: 12/27/2021 CLINICAL DATA:  61 year old male with shortness of breath. Right flank pain and hematuria. EXAM: CHEST - 2 VIEW COMPARISON:  None. FINDINGS: Cardiomegaly. Mildly tortuous  thoracic aorta. Other mediastinal contours are within normal limits. Visualized tracheal air column is within normal limits. Normal lung volumes. No pneumothorax, pulmonary edema, pleural effusion or confluent pulmonary opacity. No acute osseous abnormality identified. Negative visible bowel gas. IMPRESSION: Cardiomegaly.  No acute cardiopulmonary abnormality. Electronically Signed   By: Genevie Ann M.D.   On: 12/27/2021 04:33   DG OR UROLOGY CYSTO IMAGE ([facility] ONLY)  Result Date: 12/28/2021 There is no interpretation for this exam.  This order is for images obtained during a surgical procedure.  Please See "Surgeries" Tab for more information regarding the procedure.   ECHOCARDIOGRAM COMPLETE  Result Date: 12/28/2021    ECHOCARDIOGRAM REPORT   Patient Name:   Ricky Simmons Date of Exam: 12/28/2021 Medical Rec #:  [id number]       Height:       74.5 in Accession #:    [id number]      Weight:       160.0 lb Date of Birth:  August 09, 1961       BSA:          1.986 m Patient Age:    61 years        BP:           171/91 mmHg Patient Gender: M               HR:           84 bpm. Exam Location:  [facility] Procedure: 2D Echo, Cardiac Doppler and Color Doppler Indications:     CHF-acute diastolic J85.63  History:         Patient has no prior history of Echocardiogram examinations.                  Risk Factors:Hypertension. CKD.  Sonographer:     Sherrie Sport Referring Phys:  [place] Diagnosing Phys: Serafina Royals MD IMPRESSIONS  1. Left ventricular ejection fraction, by estimation, is 30 to 35%. The left ventricle has moderately decreased function. The left ventricle demonstrates global hypokinesis. The left ventricular internal cavity size was mildly dilated. There is moderate  left ventricular hypertrophy. Left ventricular diastolic parameters are consistent with Grade II diastolic dysfunction (pseudonormalization).  2. Right ventricular systolic function is normal. The right ventricular size is normal.  3. Left  atrial size was mild to moderately dilated.  4. The mitral valve is normal in structure. Moderate mitral valve regurgitation.  5. The aortic valve is normal in structure. Aortic valve regurgitation is trivial. FINDINGS  Left Ventricle: Left ventricular ejection fraction, by estimation, is 30 to 35%. The left ventricle has moderately decreased function. The left ventricle demonstrates global hypokinesis. The left ventricular internal cavity size was mildly dilated. There is moderate left ventricular hypertrophy. Left ventricular diastolic parameters are consistent with Grade II diastolic dysfunction (pseudonormalization). Right Ventricle: The right ventricular size is normal. No increase in right ventricular wall thickness. Right ventricular systolic function is normal. Left Atrium: Left atrial size was mild to moderately dilated. Right Atrium: Right atrial size was normal in size. Pericardium: Trivial pericardial effusion is present. Mitral Valve: The mitral valve is normal in structure. Moderate mitral valve regurgitation. MV peak gradient, 5.3  mmHg. The mean mitral valve gradient is 3.0 mmHg. Tricuspid Valve: The tricuspid valve is normal in structure. Tricuspid valve regurgitation is mild. Aortic Valve: The aortic valve is normal in structure. Aortic valve regurgitation is trivial. Aortic valve mean gradient measures 5.0 mmHg. Aortic valve peak gradient measures 8.8 mmHg. Aortic valve area, by VTI measures 2.27 cm. Pulmonic Valve: The pulmonic valve was normal in structure. Pulmonic valve regurgitation is trivial. Aorta: The aortic root and ascending aorta are structurally normal, with no evidence of dilitation. IAS/Shunts: No atrial level shunt detected by color flow Doppler.  LEFT VENTRICLE PLAX 2D LVIDd:         6.10 cm      Diastology LVIDs:         4.60 cm      LV e' medial:    3.92 cm/s LV PW:         1.40 cm      LV E/e' medial:  17.7 LV IVS:        1.20 cm      LV e' lateral:   4.24 cm/s LVOT diam:      2.10 cm      LV E/e' lateral: 16.4 LV SV:         56 LV SV Index:   28 LVOT Area:     3.46 cm  LV Volumes (MOD) LV vol d, MOD A2C: 163.0 ml LV vol d, MOD A4C: 147.0 ml LV vol s, MOD A2C: 93.7 ml LV vol s, MOD A4C: 76.0 ml LV SV MOD A2C:     69.3 ml LV SV MOD A4C:     147.0 ml LV SV MOD BP:      74.6 ml RIGHT VENTRICLE RV Basal diam:  3.00 cm RV S prime:     17.30 cm/s TAPSE (M-mode): 4.0 cm LEFT ATRIUM              Index        RIGHT ATRIUM           Index LA diam:        4.40 cm  2.22 cm/m   RA Area:     27.70 cm LA Vol (A2C):   121.0 ml 60.93 ml/m  RA Volume:   98.70 ml  49.70 ml/m LA Vol (A4C):   103.0 ml 51.86 ml/m LA Biplane Vol: 114.0 ml 57.40 ml/m  AORTIC VALVE                    PULMONIC VALVE AV Area (Vmax):    2.03 cm     PV Vmax:        0.66 m/s AV Area (Vmean):   2.14 cm     PV Vmean:       39.550 cm/s AV Area (VTI):     2.27 cm     PV VTI:         0.092 m AV Vmax:           148.00 cm/s  PV Peak grad:   1.8 mmHg AV Vmean:          99.400 cm/s  PV Mean grad:   1.0 mmHg AV VTI:            0.249 m      RVOT Peak grad: 3 mmHg AV Peak Grad:      8.8 mmHg AV Mean Grad:      5.0 mmHg LVOT Vmax:  86.80 cm/s LVOT Vmean:        61.300 cm/s LVOT VTI:          0.163 m LVOT/AV VTI ratio: 0.65  AORTA Ao Root diam: 3.60 cm MITRAL VALVE                TRICUSPID VALVE MV Area (PHT): 7.66 cm     TR Peak grad:   17.0 mmHg MV Area VTI:   2.38 cm     TR Vmax:        206.00 cm/s MV Peak grad:  5.3 mmHg MV Mean grad:  3.0 mmHg     SHUNTS MV Vmax:       1.15 m/s     Systemic VTI:  0.16 m MV Vmean:      75.6 cm/s    Systemic Diam: 2.10 cm MV Decel Time: 99 msec      Pulmonic VTI:  0.146 m MV E velocity: 69.40 cm/s MV A velocity: 122.00 cm/s MV E/A ratio:  0.57 Serafina Royals MD Electronically signed by Serafina Royals MD Signature Date/Time: 12/28/2021/9:27:32 AM    Final    CT Renal Stone Study  Result Date: 12/27/2021 CLINICAL DATA:  Flank pain.  Evaluate for kidney stone EXAM: CT ABDOMEN AND PELVIS  WITHOUT CONTRAST TECHNIQUE: Multidetector CT imaging of the abdomen and pelvis was performed following the standard protocol without IV contrast. RADIATION DOSE REDUCTION: This exam was performed according to the departmental dose-optimization program which includes automated exposure control, adjustment of the mA and/or kV according to patient size and/or use of iterative reconstruction technique. COMPARISON:  None. FINDINGS: Lower chest: Heart size is enlarged. No pleural effusion or edema. Small pericardial effusion noted. Hepatobiliary: No focal liver abnormality is seen. No gallstones, gallbladder wall thickening, or biliary dilatation. Pancreas: Unremarkable. No pancreatic ductal dilatation or surrounding inflammatory changes. Spleen: Normal in size without focal abnormality. Adrenals/Urinary Tract: Normal adrenal glands. There is a tiny stone within the right UVJ measuring 2-3 mm, image 80/2. This results in mild right hydronephrosis and hydroureter with perinephric fat stranding. Several punctate stones are identified within the upper pole the left kidney. No left-sided hydronephrosis. -Within the upper pole of the right kidney there is a 2.5 cm, 9.55 Hounsfield units, image 36/2. This is compatible with a benign cyst. -Arising off the lateral cortex of the left kidney is a 1.7 cm exophytic lesion measuring 29 Hounsfield units, image 39/2. This does not meet criteria for a simple cyst. Stomach/Bowel: Stomach is within normal limits. Appendix appears normal. No evidence of bowel wall thickening, distention, or inflammatory changes. Sigmoid diverticulosis without signs of acute diverticulitis. Vascular/Lymphatic: Aortic atherosclerosis. No signs of abdominopelvic adenopathy. Reproductive: Prostate is unremarkable. Other: Trace fluid noted within the dependent portion of the pelvis. Musculoskeletal: Spondylosis identified within the lumbar spine. No acute or suspicious osseous findings. IMPRESSION: 1.  Right-sided hydronephrosis and hydroureter secondary to a 2-3 mm right UVJ calculus. 2. Nonobstructing left nephrolithiasis. 3. Indeterminate 1.7 cm exophytic lesion arising off the lateral cortex of the left kidney. This does not meet criteria for a simple cyst. When the patient is clinically stable and able to follow directions and hold their breath (preferably as an outpatient) further evaluation with dedicated abdominal MRI without and with contrast material should be considered. 4. Cardiomegaly with small pericardial effusion. 5. Sigmoid diverticulosis without signs of acute diverticulitis. 6. Aortic Atherosclerosis (ICD10-I70.0). Electronically Signed   By: Kerby Moors M.D.   On: 12/27/2021 06:47   US BIOPSY (  KIDNEY)  Result Date: 01/01/2022 INDICATION: Proteinuria, renal failure EXAM: ULTRASOUND LEFT RENAL CORE BIOPSY MEDICATIONS: 1% LIDOCAINE LOCAL ANESTHESIA/SEDATION: Moderate (conscious) sedation was employed during this procedure. A total of Versed 1.0 mg and Fentanyl 50 mcg was administered intravenously by the radiology nurse. Total intra-service moderate Sedation Time: 15 minutes. The patient's level of consciousness and vital signs were monitored continuously by radiology nursing throughout the procedure under my direct supervision. FLUOROSCOPY TIME:  Fluoroscopy Time: NONE. COMPLICATIONS: None immediate. PROCEDURE: Informed written consent was obtained from the patient after a thorough discussion of the procedural risks, benefits and alternatives. All questions were addressed. Maximal Sterile Barrier Technique was utilized including caps, mask, sterile gowns, sterile gloves, sterile drape, hand hygiene and skin antiseptic. A timeout was performed prior to the initiation of the procedure. Previous imaging reviewed. Preliminary ultrasound performed. Left kidney lower pole was localized and marked for a posterior approach. Under sterile conditions and local anesthesia, the 15 gauge coaxial guide  was advanced to the left kidney lower pole. 18 gauge core biopsies obtained of the left kidney lower pole cortex. Samples placed on a saline moistened Telfa. Needle tract occluded with Gel-Foam. Postprocedure imaging demonstrates no large hematoma or fluid collection. Patient tolerated biopsy well. IMPRESSION: Successful ultrasound left kidney random core biopsy Electronically Signed   By: Jerilynn Mages.  Shick M.D.   On: 01/01/2022 10:26     Assessment and Recommendation  61 y.o. male who presented with malignant hypertension chronic kidney disease stage IV with mild to moderate LV systolic dysfunction likely secondary to uncontrolled hypertension over the years now status post renal stent placement and improved pain as well as hypertension control  Plan: 1.  Continue current medication management with hydralazine 100 mg 3 times per day, carvedilol 25 mg BID, amlodipine 10 mg daily for blood pressure management 2.  Continue to avoid angiotensin receptor blocker and ACE inhibitor due to concerns of chronic kidney disease 3.  No further cardiac diagnostics necessary at this time 4.  Supportive care for kidney dysfunction and kidney pain.  Patient is continuing to be stable and okay for kidney biopsy or other intervention if necessary for further evaluation and treatment options of chronic kidney disease. 5. Cardiology to sign off for now. Dr. Clayborn Bigness is on call for the remainder of the week for any other questions.   Signed, Jettie Booze, PA-C

## 2022-01-01 NOTE — Progress Notes (Signed)
[facility]  ROUNDING NOTE   Subjective:   Ricky Simmons is a 61 y.o. male with a past medical history of hypertension, nephrolithiasis, and chronic kidney disease stage III. He presents to the ED with complaints of worsening pain on the right side. He has been admitted for Kidney stone [Z61.0] Renal colic on right side [R60] AKI (acute kidney injury) ([place]) [N17.9] Hypertensive emergency [I16.1]  Patient underwent cystoscopy,right ureteroscopy with basket extraction of distal ureteral stone and stent placement on 12/28/2020 He underwent ultrasound-guided left kidney biopsy today done by radiologist.  Tolerated the procedure well. Patient's wife, Hilda Blades is at bedside. Serum creatinine continues to improve slowly Able to eat without nausea or vomiting No shortness of breath or leg edema 24-hour urine collection= > 4gm  Objective:  Vital signs in last 24 hours:  Temp:  [97.7 F (36.5 C)-98.6 F (37 C)] 98.3 F (36.8 C) (01/24 0834) Pulse Rate:  [68-76] 72 (01/24 1005) Resp:  [16-20] 16 (01/24 1005) BP: (125-146)/(75-92) 133/82 (01/24 1005) SpO2:  [96 %-100 %] 96 % (01/24 1005)  Weight change:  Filed Weights   12/27/21 0351  Weight: 72.6 kg    Intake/Output: I/O last 3 completed shifts: In: 780 [P.O.:780] Out: -    Intake/Output this shift:  Total I/O In: 240 [P.O.:240] Out: -   Physical Exam: General: In no acute distress  Head: Normocephalic, atraumatic. Moist oral mucosal membranes  Eyes: Anicteric  Lungs:  Respirations even,unlabored, lungs clear  Heart: S1S2, no rubs or gallops  Abdomen:  Soft, non-tender,non distended  Extremities:  No peripheral edema.  Neurologic: Awake,alert,oriented  Skin: No lesions or rashes       Basic Metabolic Panel: Recent Labs  Lab 12/28/21 0440 12/29/21 0512 12/30/21 0817 12/31/21 0748 01/01/22 0443  NA 135 130* 136 134* 134*  K 4.8 5.3* 4.4 4.9 4.3  CL 110 105 104 105 104  CO2 18* 18* 21* 23 22   GLUCOSE 99 213* 92 102* 108*  BUN 54* 62* 70* 64* 65*  CREATININE 5.16* 5.49* 4.93* 4.56* 4.33*  CALCIUM 8.3* 8.1* 8.3* 8.1* 8.4*     Liver Function Tests: Recent Labs  Lab 12/27/21 0404  AST 19  ALT 19  ALKPHOS 52  BILITOT 0.6  PROT 6.6  ALBUMIN 3.1*    Recent Labs  Lab 12/27/21 0404  LIPASE 43    No results for input(s): AMMONIA in the last 168 hours.  CBC: Recent Labs  Lab 12/27/21 0404 12/28/21 0440 12/29/21 0512  WBC 12.6* 9.5 6.8  HGB 11.5* 10.9* 10.6*  HCT 35.2* 33.3* 32.4*  MCV 93.4 92.0 92.0  PLT 216 177 185     Cardiac Enzymes: No results for input(s): CKTOTAL, CKMB, CKMBINDEX, TROPONINI in the last 168 hours.  BNP: Invalid input(s): POCBNP  CBG: No results for input(s): GLUCAP in the last 168 hours.  Microbiology: Results for orders placed or performed during the hospital encounter of 12/27/21  Urine Culture     Status: None   Collection Time: 12/27/21  5:50 AM   Specimen: Urine, Clean Catch  Result Value Ref Range Status   Specimen Description   Final    URINE, CLEAN CATCH Performed at [facility], [address], [place], [place] [address]    Special Requests   Final    NONE Performed at [facility], [address], [place], [place] [address]    Culture   Final    NO GROWTH Performed at [facility], [address], [place], [place] [address]    Report Status 12/28/2021 FINAL  Final  Resp Panel by RT-PCR (Flu A&B, Covid) Nasopharyngeal Swab     Status: None   Collection Time: 12/27/21  7:17 AM   Specimen: Nasopharyngeal Swab; Nasopharyngeal(NP) swabs in vial transport medium  Result Value Ref Range Status   SARS Coronavirus 2 by RT PCR NEGATIVE NEGATIVE Final    Comment: (NOTE) SARS-CoV-2 target nucleic acids are NOT DETECTED.  The SARS-CoV-2 RNA is generally detectable in upper respiratory specimens during the acute phase of infection. The lowest concentration of SARS-CoV-2 viral copies this  assay can detect is 138 copies/mL. A negative result does not preclude SARS-Cov-2 infection and should not be used as the sole basis for treatment or other patient management decisions. A negative result may occur with  improper specimen collection/handling, submission of specimen other than nasopharyngeal swab, presence of viral mutation(s) within the areas targeted by this assay, and inadequate number of viral copies(<138 copies/mL). A negative result must be combined with clinical observations, patient history, and epidemiological information. The expected result is Negative.  Fact Sheet for Patients:  EntrepreneurPulse.com.au  Fact Sheet for Healthcare Providers:  IncredibleEmployment.be  This test is no t yet approved or cleared by the [place] FDA and  has been authorized for detection and/or diagnosis of SARS-CoV-2 by FDA under an Emergency Use Authorization (EUA). This EUA will remain  in effect (meaning this test can be used) for the duration of the COVID-19 declaration under Section 564(b)(1) of the Act, 21 U.S.C.section 360bbb-3(b)(1), unless the authorization is terminated  or revoked sooner.       Influenza A by PCR NEGATIVE NEGATIVE Final   Influenza B by PCR NEGATIVE NEGATIVE Final    Comment: (NOTE) The Xpert Xpress SARS-CoV-2/FLU/RSV plus assay is intended as an aid in the diagnosis of influenza from Nasopharyngeal swab specimens and should not be used as a sole basis for treatment. Nasal washings and aspirates are unacceptable for Xpert Xpress SARS-CoV-2/FLU/RSV testing.  Fact Sheet for Patients: EntrepreneurPulse.com.au  Fact Sheet for Healthcare Providers: IncredibleEmployment.be  This test is not yet approved or cleared by the [place] FDA and has been authorized for detection and/or diagnosis of SARS-CoV-2 by FDA under an Emergency Use Authorization (EUA). This EUA will  remain in effect (meaning this test can be used) for the duration of the COVID-19 declaration under Section 564(b)(1) of the Act, 21 U.S.C. section 360bbb-3(b)(1), unless the authorization is terminated or revoked.  Performed at [facility], [address] [place], [place] [address]     Coagulation Studies: Recent Labs    01/01/22 0443  LABPROT 12.9  INR 1.0    Urinalysis: No results for input(s): COLORURINE, LABSPEC, PHURINE, GLUCOSEU, HGBUR, BILIRUBINUR, KETONESUR, PROTEINUR, UROBILINOGEN, NITRITE, LEUKOCYTESUR in the last 72 hours.  Invalid input(s): APPERANCEUR     Imaging: US BIOPSY (KIDNEY)  Result Date: 01/01/2022 INDICATION: Proteinuria, renal failure EXAM: ULTRASOUND LEFT RENAL CORE BIOPSY MEDICATIONS: 1% LIDOCAINE LOCAL ANESTHESIA/SEDATION: Moderate (conscious) sedation was employed during this procedure. A total of Versed 1.0 mg and Fentanyl 50 mcg was administered intravenously by the radiology nurse. Total intra-service moderate Sedation Time: 15 minutes. The patient's level of consciousness and vital signs were monitored continuously by radiology nursing throughout the procedure under my direct supervision. FLUOROSCOPY TIME:  Fluoroscopy Time: NONE. COMPLICATIONS: None immediate. PROCEDURE: Informed written consent was obtained from the patient after a thorough discussion of the procedural risks, benefits and alternatives. All questions  were addressed. Maximal Sterile Barrier Technique was utilized including caps, mask, sterile gowns, sterile gloves, sterile drape, hand hygiene and skin antiseptic. A timeout was performed prior to the initiation of the procedure. Previous imaging reviewed. Preliminary ultrasound performed. Left kidney lower pole was localized and marked for a posterior approach. Under sterile conditions and local anesthesia, the 15 gauge coaxial guide was advanced to the left kidney lower pole. 18 gauge core biopsies obtained of the left kidney  lower pole cortex. Samples placed on a saline moistened Telfa. Needle tract occluded with Gel-Foam. Postprocedure imaging demonstrates no large hematoma or fluid collection. Patient tolerated biopsy well. IMPRESSION: Successful ultrasound left kidney random core biopsy Electronically Signed   By: Jerilynn Mages.  Shick M.D.   On: 01/01/2022 10:26     Medications:     amLODipine  10 mg Oral BH-q7a   carvedilol  25 mg Oral BID   feeding supplement  237 mL Oral TID BM   fentaNYL       hydrALAZINE       hydrALAZINE  100 mg Oral Q8H   influenza vac split quadrivalent PF  0.5 mL Intramuscular Tomorrow-1000   labetalol       midazolam       multivitamin with minerals  1 tablet Oral Daily   nicotine  14 mg Transdermal Daily   pneumococcal 23 valent vaccine  0.5 mL Intramuscular Tomorrow-1000   tamsulosin  0.4 mg Oral QPC supper   acetaminophen **OR** acetaminophen, acetaminophen, hydrALAZINE, HYDROmorphone (DILAUDID) injection, ondansetron **OR** ondansetron (ZOFRAN) IV, oxybutynin, oxyCODONE  Assessment/ Plan:  Ricky Simmons is a 61 y.o.  male with a past medical history of hypertension, nephrolithiasis, and chronic kidney disease stage III. He presents to the ED with complaints of worsening pain on the right side. He has been admitted for Kidney stone [G25.6] Renal colic on right side [L89] AKI (acute kidney injury) ([place]) [N17.9] Hypertensive emergency [I16.1]   # Acute Kidney Injury on chronic kidney disease stage IIIb with proteinuria - baseline creatinine 1.85 and GFR of 40.  Acute kidney injury secondary to hydronephrosis from renal stone Chronic kidney disease is secondary to hypertension Work-up for proteinuria is in progress.  24-hour urine collection for total protein is > 4 gm suggesting nephrotic range proteinuria;  Patient underwent kidney biopsy today.  Tolerated well. Postprocedure restrictions discussed. Will schedule follow-up in about 1 week as outpatient. Possible discharge  in the morning. Follow-up in office Thursday, February 2, at 1 PM  Lab Results  Component Value Date   CREATININE 4.33 (H) 01/01/2022   CREATININE 4.56 (H) 12/31/2021   CREATININE 4.93 (H) 12/30/2021    Intake/Output Summary (Last 24 hours) at 01/01/2022 1357 Last data filed at 01/01/2022 0900 Gross per 24 hour  Intake 660 ml  Output --  Net 660 ml    # Renal stone and rt hydronephrosis He underwent cystoscopy,right ureteroscopy with basket extraction of distal ureteral stone and stent placement on 12/28/2020.  # Left kidney exophytic lesion left kidney Will need outpatient follow up with urology  # Hypertension with chronic kidney disease.  currently getting amlodipine, carvedilol, hydralazine.     LOS: 5 Victorio Creeden 1/24/20231:57 PM

## 2022-01-02 LAB — BASIC METABOLIC PANEL
Anion gap: 8 (ref 5–15)
BUN: 64 mg/dL — ABNORMAL HIGH (ref 6–20)
CO2: 26 mmol/L (ref 22–32)
Calcium: 8.4 mg/dL — ABNORMAL LOW (ref 8.9–10.3)
Chloride: 104 mmol/L (ref 98–111)
Creatinine, Ser: 4.17 mg/dL — ABNORMAL HIGH (ref 0.61–1.24)
GFR, Estimated: 16 mL/min — ABNORMAL LOW (ref >60)
Glucose, Bld: 107 mg/dL — ABNORMAL HIGH (ref 70–99)
Potassium: 3.8 mmol/L (ref 3.5–5.1)
Sodium: 138 mmol/L (ref 135–145)

## 2022-01-02 LAB — CBC
HCT: 31.5 % — ABNORMAL LOW (ref 39.0–52.0)
Hemoglobin: 10.2 g/dL — ABNORMAL LOW (ref 13.0–17.0)
MCH: 30.2 pg (ref 26.0–34.0)
MCHC: 32.4 g/dL (ref 30.0–36.0)
MCV: 93.2 fL (ref 80.0–100.0)
Platelets: 188 10*3/uL (ref 150–400)
RBC: 3.38 MIL/uL — ABNORMAL LOW (ref 4.22–5.81)
RDW: 14 % (ref 11.5–15.5)
WBC: 8 10*3/uL (ref 4.0–10.5)
nRBC: 0 % (ref 0.0–0.2)

## 2022-01-02 LAB — ANCA TITERS
Atypical P-ANCA titer: <1:20 {titer}
C-ANCA: <1:20 {titer}
P-ANCA: <1:20 {titer}

## 2022-01-02 MED ORDER — TAMSULOSIN HCL 0.4 MG PO CAPS: 0.4000 mg | ORAL_CAPSULE | Freq: Every day | ORAL | 0 refills | Status: AC

## 2022-01-02 NOTE — Progress Notes (Signed)
[facility]  ROUNDING NOTE   Subjective:   Ricky Simmons is a 61 y.o. male with a past medical history of hypertension, nephrolithiasis, and chronic kidney disease stage III. He presents to the ED with complaints of worsening pain on the right side. He has been admitted for Kidney stone [O67.1] Renal colic on right side [I45] AKI (acute kidney injury) ([place]) [N17.9] Hypertensive emergency [I16.1]  Patient underwent cystoscopy,right ureteroscopy with basket extraction of distal ureteral stone and stent placement on 12/28/2020 He underwent ultrasound-guided left kidney biopsy on 01/02/22 done by radiologist.  Tolerated the procedure well.  24-hour urine collection= > 4gm  Patient resting comfortably Denies hematuria or discomfort Tolerating meals, appetite improving Shortness of breath  Creatinine slowly improving at 4.17 Hemoglobin stable at 10.2  Objective:  Vital signs in last 24 hours:  Temp:  [98.1 F (36.7 C)-98.7 F (37.1 C)] 98.1 F (36.7 C) (01/25 0809) Pulse Rate:  [73-76] 73 (01/25 0809) Resp:  [16-20] 16 (01/25 0422) BP: (120-133)/(76-84) 120/76 (01/25 0809) SpO2:  [97 %-99 %] 98 % (01/25 0809)  Weight change:  Filed Weights   12/27/21 0351  Weight: 72.6 kg    Intake/Output: I/O last 3 completed shifts: In: 66 [P.O.:720] Out: -    Intake/Output this shift:  Total I/O In: 600 [P.O.:600] Out: -   Physical Exam: General: In no acute distress  Head: Normocephalic, atraumatic. Moist oral mucosal membranes  Eyes: Anicteric  Lungs:  Respirations even,unlabored, lungs clear  Heart: S1S2, no rubs or gallops  Abdomen:  Soft, non-tender,non distended  Extremities:  No peripheral edema.  Neurologic: Awake,alert,oriented  Skin: No lesions or rashes       Basic Metabolic Panel: Recent Labs  Lab 12/29/21 0512 12/30/21 0817 12/31/21 0748 01/01/22 0443 01/02/22 0927  NA 130* 136 134* 134* 138  K 5.3* 4.4 4.9 4.3 3.8  CL 105 104 105 104  104  CO2 18* 21* 23 22 26   GLUCOSE 213* 92 102* 108* 107*  BUN 62* 70* 64* 65* 64*  CREATININE 5.49* 4.93* 4.56* 4.33* 4.17*  CALCIUM 8.1* 8.3* 8.1* 8.4* 8.4*     Liver Function Tests: Recent Labs  Lab 12/27/21 0404  AST 19  ALT 19  ALKPHOS 52  BILITOT 0.6  PROT 6.6  ALBUMIN 3.1*    Recent Labs  Lab 12/27/21 0404  LIPASE 43    No results for input(s): AMMONIA in the last 168 hours.  CBC: Recent Labs  Lab 12/27/21 0404 12/28/21 0440 12/29/21 0512 01/02/22 0927  WBC 12.6* 9.5 6.8 8.0  HGB 11.5* 10.9* 10.6* 10.2*  HCT 35.2* 33.3* 32.4* 31.5*  MCV 93.4 92.0 92.0 93.2  PLT 216 177 185 188     Cardiac Enzymes: No results for input(s): CKTOTAL, CKMB, CKMBINDEX, TROPONINI in the last 168 hours.  BNP: Invalid input(s): POCBNP  CBG: No results for input(s): GLUCAP in the last 168 hours.  Microbiology: Results for orders placed or performed during the hospital encounter of 12/27/21  Urine Culture     Status: None   Collection Time: 12/27/21  5:50 AM   Specimen: Urine, Clean Catch  Result Value Ref Range Status   Specimen Description   Final    URINE, CLEAN CATCH Performed at [facility], [address], [place], [place] [address]    Special Requests   Final    NONE Performed at [facility], [address], [place], [place] [address]    Culture   Final  NO GROWTH Performed at [facility], [place] [address] [place], [place] [address]    Report Status 12/28/2021 FINAL  Final  Resp Panel by RT-PCR (Flu A&B, Covid) Nasopharyngeal Swab     Status: None   Collection Time: 12/27/21  7:17 AM   Specimen: Nasopharyngeal Swab; Nasopharyngeal(NP) swabs in vial transport medium  Result Value Ref Range Status   SARS Coronavirus 2 by RT PCR NEGATIVE NEGATIVE Final    Comment: (NOTE) SARS-CoV-2 target nucleic acids are NOT DETECTED.  The SARS-CoV-2 RNA is generally detectable in upper respiratory specimens during the acute phase of  infection. The lowest concentration of SARS-CoV-2 viral copies this assay can detect is 138 copies/mL. A negative result does not preclude SARS-Cov-2 infection and should not be used as the sole basis for treatment or other patient management decisions. A negative result may occur with  improper specimen collection/handling, submission of specimen other than nasopharyngeal swab, presence of viral mutation(s) within the areas targeted by this assay, and inadequate number of viral copies(<138 copies/mL). A negative result must be combined with clinical observations, patient history, and epidemiological information. The expected result is Negative.  Fact Sheet for Patients:  EntrepreneurPulse.com.au  Fact Sheet for Healthcare Providers:  IncredibleEmployment.be  This test is no t yet approved or cleared by the [place] FDA and  has been authorized for detection and/or diagnosis of SARS-CoV-2 by FDA under an Emergency Use Authorization (EUA). This EUA will remain  in effect (meaning this test can be used) for the duration of the COVID-19 declaration under Section 564(b)(1) of the Act, 21 U.S.C.section 360bbb-3(b)(1), unless the authorization is terminated  or revoked sooner.       Influenza A by PCR NEGATIVE NEGATIVE Final   Influenza B by PCR NEGATIVE NEGATIVE Final    Comment: (NOTE) The Xpert Xpress SARS-CoV-2/FLU/RSV plus assay is intended as an aid in the diagnosis of influenza from Nasopharyngeal swab specimens and should not be used as a sole basis for treatment. Nasal washings and aspirates are unacceptable for Xpert Xpress SARS-CoV-2/FLU/RSV testing.  Fact Sheet for Patients: EntrepreneurPulse.com.au  Fact Sheet for Healthcare Providers: IncredibleEmployment.be  This test is not yet approved or cleared by the [place] FDA and has been authorized for detection and/or diagnosis of SARS-CoV-2  by FDA under an Emergency Use Authorization (EUA). This EUA will remain in effect (meaning this test can be used) for the duration of the COVID-19 declaration under Section 564(b)(1) of the Act, 21 U.S.C. section 360bbb-3(b)(1), unless the authorization is terminated or revoked.  Performed at [facility], [address] [place], [place] [address]     Coagulation Studies: Recent Labs    01/01/22 0443  LABPROT 12.9  INR 1.0     Urinalysis: No results for input(s): COLORURINE, LABSPEC, PHURINE, GLUCOSEU, HGBUR, BILIRUBINUR, KETONESUR, PROTEINUR, UROBILINOGEN, NITRITE, LEUKOCYTESUR in the last 72 hours.  Invalid input(s): APPERANCEUR     Imaging: US BIOPSY (KIDNEY)  Result Date: 01/01/2022 INDICATION: Proteinuria, renal failure EXAM: ULTRASOUND LEFT RENAL CORE BIOPSY MEDICATIONS: 1% LIDOCAINE LOCAL ANESTHESIA/SEDATION: Moderate (conscious) sedation was employed during this procedure. A total of Versed 1.0 mg and Fentanyl 50 mcg was administered intravenously by the radiology nurse. Total intra-service moderate Sedation Time: 15 minutes. The patient's level of consciousness and vital signs were monitored continuously by radiology nursing throughout the procedure under my direct supervision. FLUOROSCOPY TIME:  Fluoroscopy Time: NONE. COMPLICATIONS: None immediate. PROCEDURE: Informed written consent was obtained from the patient after a thorough discussion of  the procedural risks, benefits and alternatives. All questions were addressed. Maximal Sterile Barrier Technique was utilized including caps, mask, sterile gowns, sterile gloves, sterile drape, hand hygiene and skin antiseptic. A timeout was performed prior to the initiation of the procedure. Previous imaging reviewed. Preliminary ultrasound performed. Left kidney lower pole was localized and marked for a posterior approach. Under sterile conditions and local anesthesia, the 15 gauge coaxial guide was advanced to the left  kidney lower pole. 18 gauge core biopsies obtained of the left kidney lower pole cortex. Samples placed on a saline moistened Telfa. Needle tract occluded with Gel-Foam. Postprocedure imaging demonstrates no large hematoma or fluid collection. Patient tolerated biopsy well. IMPRESSION: Successful ultrasound left kidney random core biopsy Electronically Signed   By: Jerilynn Mages.  Shick M.D.   On: 01/01/2022 10:26     Medications:     amLODipine  10 mg Oral BH-q7a   carvedilol  25 mg Oral BID   feeding supplement  237 mL Oral TID BM   hydrALAZINE  100 mg Oral Q8H   influenza vac split quadrivalent PF  0.5 mL Intramuscular Tomorrow-1000   multivitamin with minerals  1 tablet Oral Daily   nicotine  14 mg Transdermal Daily   pneumococcal 23 valent vaccine  0.5 mL Intramuscular Tomorrow-1000   tamsulosin  0.4 mg Oral QPC supper   acetaminophen **OR** acetaminophen, acetaminophen, hydrALAZINE, HYDROmorphone (DILAUDID) injection, ondansetron **OR** ondansetron (ZOFRAN) IV, oxybutynin, oxyCODONE  Assessment/ Plan:  Mr. Ricky Simmons is a 61 y.o.  male with a past medical history of hypertension, nephrolithiasis, and chronic kidney disease stage III. He presents to the ED with complaints of worsening pain on the right side. He has been admitted for Kidney stone [D14.9] Renal colic on right side [F02] AKI (acute kidney injury) ([place]) [N17.9] Hypertensive emergency [I16.1]   # Acute Kidney Injury on chronic kidney disease stage IIIb with proteinuria - baseline creatinine 1.85 and GFR of 40.  Acute kidney injury secondary to hydronephrosis from renal stone Chronic kidney disease is secondary to hypertension Work-up for proteinuria is in progress.  24-hour urine collection for total protein is > 4 gm suggesting nephrotic range proteinuria;  Kidney biopsy completed on 01/01/2022, tolerated well. He denies hematuria or discomfort.  Biopsy results expected in 5 to 7 days. Follow-up in office Thursday, February  2, at 1 PM  Lab Results  Component Value Date   CREATININE 4.17 (H) 01/02/2022   CREATININE 4.33 (H) 01/01/2022   CREATININE 4.56 (H) 12/31/2021    Intake/Output Summary (Last 24 hours) at 01/02/2022 1402 Last data filed at 01/02/2022 1040 Gross per 24 hour  Intake 840 ml  Output --  Net 840 ml    # Renal stone and rt hydronephrosis He underwent cystoscopy,right ureteroscopy with basket extraction of distal ureteral stone and stent placement on 12/28/2020.  # Left kidney exophytic lesion left kidney Will need outpatient follow up with urology  # Hypertension with chronic kidney disease.  currently getting amlodipine, carvedilol, hydralazine. Stable BP    LOS: 6 Parilee Hally 1/25/20232:02 PM

## 2022-01-02 NOTE — Plan of Care (Signed)
Problem: Education: Goal: Knowledge of General Education information will improve Description: Including pain rating scale, medication(s)/side effects and non-pharmacologic comfort measures Outcome: Completed/Met   Problem: Nutrition: Goal: Adequate nutrition will be maintained Outcome: Completed/Met   Problem: Pain Managment: Goal: General experience of comfort will improve Outcome: Completed/Met   Problem: Safety: Goal: Ability to remain free from injury will improve Outcome: Completed/Met   Problem: Inadequate Intake (NI-2.1) Goal: Food and/or nutrient delivery Description: Individualized approach for food/nutrient provision. Outcome: Completed/Met   Problem: Education: Goal: Required Educational Video(s) Outcome: Completed/Met   Problem: Clinical Measurements: Goal: Postoperative complications will be avoided or minimized Outcome: Completed/Met   Problem: Skin Integrity: Goal: Demonstration of wound healing without infection will improve Outcome: Completed/Met

## 2022-01-02 NOTE — Discharge Summary (Signed)
Physician Discharge Summary  Ricky Simmons:[id number] DOB: 1961/09/21 DOA: 12/27/2021  PCP: Ricky Elk, MD  Admit date: 12/27/2021 Discharge date: 01/02/2022  Admitted From: home Disposition:  home   Recommendations for Outpatient Follow-up:  Follow up with PCP in 1-2 weeks F/u w/ nephro, Ricky Simmons, in 1 week F/u w/ cardio, Ricky Simmons, in 1-2 weeks   Home Health: no  Equipment/Devices:  Discharge Condition: stable  CODE STATUS: full  Diet recommendation: Heart Healthy   Brief/Interim Summary: HPI was taken from Ricky Simmons:  Ricky Simmons is a 61 y.o. male with medical history significant for nephrolithiasis, hypertension, chronic kidney disease stage III, nicotine dependence, medication noncompliance who presents to the ER for evaluation of 3-day history of right flank pain that has progressively worsened. Pain intensity at its worst with radiation down to the groin associated with dysuria, frequency and hematuria.  Pain feels similar to his prior episodes of kidney stones. He also complains of shortness of breath mostly with exertion and has a cough productive of greenish phlegm.  He is unable to lay flat. He denies having any fever, no chills, no palpitations, no diaphoresis, no nausea, no vomiting, no headache, no dizziness or lightheadedness.  He has no changes in his bowel habits. Sodium 134, potassium 4.7, chloride 109, bicarb 19, BUN 49, creatinine 4.64 compared to baseline of 1.60 from 2021, calcium 8.6, alkaline phosphatase 52, albumin 3.1, lipase 43, AST 19, ALT 19, total protein 6.6, BNP 2543, troponin 58, white count 12.6, hemoglobin 11.5, hematocrit 35.2, MCV 93.4, RDW 14.1, platelet count 216 Respiratory viral panel is pending Urinalysis is sterile Renal stone CT shows right-sided hydronephrosis and hydroureter secondary to a 2 to 3 mm right UVJ calculus.  Nonobstructing left nephrolithiasis.  Indeterminate 1.7 cm exophytic lesion arising off the lateral  cortex of the left kidney.  Cardiomegaly with small pericardial effusion. Chest x-ray reviewed by me shows cardiomegaly with no pleural effusion. EKG reviewed by me shows normal sinus rhythm with left axis deviation and LVH.  ST changes in the lateral leads.    ED Course: Patient is a 61 year old male who presents to the ER for evaluation of right flank pain and is noted to have worsening of his renal function from baseline. At baseline his serum creatinine is between 1.6 -1.85 and today on admission it is 4.64. Blood pressure is also significantly elevated and patient admits to being noncompliant with his prescribed medications. Renal stone CT shows right-sided hydronephrosis with an obstructing right UVJ calculus. He will be admitted for further evaluation.  As per Dr. Leslye Simmons: 61 year old man with past medical history of chronic kidney disease stage IIIb, hypertension and GERD.  He presented to the hospital with abdominal pain and found to have a right distal ureteral calculus with right-sided hydronephrosis and hydroureter.  Dr. Diamantina Simmons did a cystoscopy on 12/28/2021 and had stone extraction and stent placement.  The patient was found to have nephrotic range proteinuria and creatinine much more impaired than baseline.  A kidney biopsy was done on 01/01/2022.   As per Dr. Jimmye Simmons 01/02/22: Pt's Cr improved to 4.17 at d/c. Pt will f/u w/ nephro in 1 week to get kidney biopsy results. Pt was medically stable.    Discharge Diagnoses:  Principal Problem:   Acute unilateral obstructive uropathy Active Problems:   Accelerated hypertension   Renal colic on right side   Hypertensive urgency   Chronic kidney disease   Acute kidney injury superimposed on CKD ([place])  Nicotine dependence   Non compliance w medication regimen   Chronic systolic CHF (congestive heart failure) (HCC)   Left kidney mass   Hyperkalemia   Nephrotic range proteinuria  AKI on CKDIIIb: w/ nephrotic range proteinuria on  24-hour urine. S/p kidney biopsy on 01/01/22.  Baseline creatinine around 1.85. Cr is trending down from day prior   Right distal ureteral calculus: with right-sided hydronephrosis and hydroureter. S/p cystoscopy on 12/28/2021 and did a stone extraction and stent placement. Continue flomax.  Will need outpatient stent removal.  Hyperkalemia: resolved  HTN: continue on coreg, amlodipine, hydralazine   Chronic combined CHF: echo shows EF 30-35%, grade II diastolic dysfunction. Continue on coreg  Lesion on left kidney: 1.7 cm. Will need outpatient MRI   Discharge Instructions  Discharge Instructions     Diet - low sodium heart healthy   Complete by: As directed    Discharge instructions   Complete by: As directed    F/u w/ PCP in 1-2 weeks. F/u w/ nephro, Ricky Simmons, in 1 week. F/u w cardio, Ricky Simmons, in 1-2 weeks   Increase activity slowly   Complete by: As directed    No wound care   Complete by: As directed       Allergies as of 01/02/2022   No Known Allergies      Medication List     STOP taking these medications    losartan 100 MG tablet Commonly known as: COZAAR       TAKE these medications    acetaminophen 500 MG tablet Commonly known as: TYLENOL Take 1,000 mg by mouth every 6 (six) hours as needed (for pain/headaches.).   amLODipine 10 MG tablet Commonly known as: NORVASC Take 10 mg by mouth every morning.   carvedilol 25 MG tablet Commonly known as: COREG Take 25 mg by mouth 2 (two) times daily.   hydrALAZINE 50 MG tablet Commonly known as: APRESOLINE Take 50 mg by mouth 2 (two) times daily.   tamsulosin 0.4 MG Caps capsule Commonly known as: FLOMAX Take 1 capsule (0.4 mg total) by mouth daily after supper.        Follow-up Information     Ricky Skains, MD Follow up in 1 week(s).   Specialty: Cardiology Contact information: [address] [place] [address] [phone]                 No Known Allergies  Consultations: Nephro Cardio    Procedures/Studies: DG Chest 2 View  Result Date: 12/27/2021 CLINICAL DATA:  61 year old male with shortness of breath. Right flank pain and hematuria. EXAM: CHEST - 2 VIEW COMPARISON:  None. FINDINGS: Cardiomegaly. Mildly tortuous thoracic aorta. Other mediastinal contours are within normal limits. Visualized tracheal air column is within normal limits. Normal lung volumes. No pneumothorax, pulmonary edema, pleural effusion or confluent pulmonary opacity. No acute osseous abnormality identified. Negative visible bowel gas. IMPRESSION: Cardiomegaly.  No acute cardiopulmonary abnormality. Electronically Signed   By: Genevie Ann M.D.   On: 12/27/2021 04:33   DG OR UROLOGY CYSTO IMAGE ([facility] ONLY)  Result Date: 12/28/2021 There is no interpretation for this exam.  This order is for images obtained during a surgical procedure.  Please See "Surgeries" Tab for more information regarding the procedure.   ECHOCARDIOGRAM COMPLETE  Result Date: 12/28/2021    ECHOCARDIOGRAM REPORT   Patient Name:   NOAL ABSHIER Date of Exam: 12/28/2021 Medical Rec #:  [id number]  Height:       74.5 in Accession #:    [id number]      Weight:       160.0 lb Date of Birth:  Feb 15, 1961       BSA:          1.986 m Patient Age:    79 years        BP:           171/91 mmHg Patient Gender: M               HR:           84 bpm. Exam Location:  [facility] Procedure: 2D Echo, Cardiac Doppler and Color Doppler Indications:     CHF-acute diastolic W29.56  History:         Patient has no prior history of Echocardiogram examinations.                  Risk Factors:Hypertension. CKD.  Sonographer:     Sherrie Sport Referring Phys:  [place] Diagnosing Phys: Serafina Royals MD IMPRESSIONS  1. Left ventricular ejection fraction, by estimation, is 30 to 35%. The left ventricle has moderately decreased function. The left ventricle demonstrates global hypokinesis. The left  ventricular internal cavity size was mildly dilated. There is moderate  left ventricular hypertrophy. Left ventricular diastolic parameters are consistent with Grade II diastolic dysfunction (pseudonormalization).  2. Right ventricular systolic function is normal. The right ventricular size is normal.  3. Left atrial size was mild to moderately dilated.  4. The mitral valve is normal in structure. Moderate mitral valve regurgitation.  5. The aortic valve is normal in structure. Aortic valve regurgitation is trivial. FINDINGS  Left Ventricle: Left ventricular ejection fraction, by estimation, is 30 to 35%. The left ventricle has moderately decreased function. The left ventricle demonstrates global hypokinesis. The left ventricular internal cavity size was mildly dilated. There is moderate left ventricular hypertrophy. Left ventricular diastolic parameters are consistent with Grade II diastolic dysfunction (pseudonormalization). Right Ventricle: The right ventricular size is normal. No increase in right ventricular wall thickness. Right ventricular systolic function is normal. Left Atrium: Left atrial size was mild to moderately dilated. Right Atrium: Right atrial size was normal in size. Pericardium: Trivial pericardial effusion is present. Mitral Valve: The mitral valve is normal in structure. Moderate mitral valve regurgitation. MV peak gradient, 5.3 mmHg. The mean mitral valve gradient is 3.0 mmHg. Tricuspid Valve: The tricuspid valve is normal in structure. Tricuspid valve regurgitation is mild. Aortic Valve: The aortic valve is normal in structure. Aortic valve regurgitation is trivial. Aortic valve mean gradient measures 5.0 mmHg. Aortic valve peak gradient measures 8.8 mmHg. Aortic valve area, by VTI measures 2.27 cm. Pulmonic Valve: The pulmonic valve was normal in structure. Pulmonic valve regurgitation is trivial. Aorta: The aortic root and ascending aorta are structurally normal, with no evidence of  dilitation. IAS/Shunts: No atrial level shunt detected by color flow Doppler.  LEFT VENTRICLE PLAX 2D LVIDd:         6.10 cm      Diastology LVIDs:         4.60 cm      LV e' medial:    3.92 cm/s LV PW:         1.40 cm      LV E/e' medial:  17.7 LV IVS:        1.20 cm      LV e' lateral:   4.24 cm/s LVOT  diam:     2.10 cm      LV E/e' lateral: 16.4 LV SV:         56 LV SV Index:   28 LVOT Area:     3.46 cm  LV Volumes (MOD) LV vol d, MOD A2C: 163.0 ml LV vol d, MOD A4C: 147.0 ml LV vol s, MOD A2C: 93.7 ml LV vol s, MOD A4C: 76.0 ml LV SV MOD A2C:     69.3 ml LV SV MOD A4C:     147.0 ml LV SV MOD BP:      74.6 ml RIGHT VENTRICLE RV Basal diam:  3.00 cm RV S prime:     17.30 cm/s TAPSE (M-mode): 4.0 cm LEFT ATRIUM              Index        RIGHT ATRIUM           Index LA diam:        4.40 cm  2.22 cm/m   RA Area:     27.70 cm LA Vol (A2C):   121.0 ml 60.93 ml/m  RA Volume:   98.70 ml  49.70 ml/m LA Vol (A4C):   103.0 ml 51.86 ml/m LA Biplane Vol: 114.0 ml 57.40 ml/m  AORTIC VALVE                    PULMONIC VALVE AV Area (Vmax):    2.03 cm     PV Vmax:        0.66 m/s AV Area (Vmean):   2.14 cm     PV Vmean:       39.550 cm/s AV Area (VTI):     2.27 cm     PV VTI:         0.092 m AV Vmax:           148.00 cm/s  PV Peak grad:   1.8 mmHg AV Vmean:          99.400 cm/s  PV Mean grad:   1.0 mmHg AV VTI:            0.249 m      RVOT Peak grad: 3 mmHg AV Peak Grad:      8.8 mmHg AV Mean Grad:      5.0 mmHg LVOT Vmax:         86.80 cm/s LVOT Vmean:        61.300 cm/s LVOT VTI:          0.163 m LVOT/AV VTI ratio: 0.65  AORTA Ao Root diam: 3.60 cm MITRAL VALVE                TRICUSPID VALVE MV Area (PHT): 7.66 cm     TR Peak grad:   17.0 mmHg MV Area VTI:   2.38 cm     TR Vmax:        206.00 cm/s MV Peak grad:  5.3 mmHg MV Mean grad:  3.0 mmHg     SHUNTS MV Vmax:       1.15 m/s     Systemic VTI:  0.16 m MV Vmean:      75.6 cm/s    Systemic Diam: 2.10 cm MV Decel Time: 99 msec      Pulmonic VTI:  0.146 m MV E  velocity: 69.40 cm/s MV A velocity: 122.00 cm/s MV E/A ratio:  0.57 Serafina Royals MD Electronically signed by Serafina Royals MD Signature Date/Time: 12/28/2021/9:27:32  AM    Final    CT Renal Stone Study  Result Date: 12/27/2021 CLINICAL DATA:  Flank pain.  Evaluate for kidney stone EXAM: CT ABDOMEN AND PELVIS WITHOUT CONTRAST TECHNIQUE: Multidetector CT imaging of the abdomen and pelvis was performed following the standard protocol without IV contrast. RADIATION DOSE REDUCTION: This exam was performed according to the departmental dose-optimization program which includes automated exposure control, adjustment of the mA and/or kV according to patient size and/or use of iterative reconstruction technique. COMPARISON:  None. FINDINGS: Lower chest: Heart size is enlarged. No pleural effusion or edema. Small pericardial effusion noted. Hepatobiliary: No focal liver abnormality is seen. No gallstones, gallbladder wall thickening, or biliary dilatation. Pancreas: Unremarkable. No pancreatic ductal dilatation or surrounding inflammatory changes. Spleen: Normal in size without focal abnormality. Adrenals/Urinary Tract: Normal adrenal glands. There is a tiny stone within the right UVJ measuring 2-3 mm, image 80/2. This results in mild right hydronephrosis and hydroureter with perinephric fat stranding. Several punctate stones are identified within the upper pole the left kidney. No left-sided hydronephrosis. -Within the upper pole of the right kidney there is a 2.5 cm, 9.55 Hounsfield units, image 36/2. This is compatible with a benign cyst. -Arising off the lateral cortex of the left kidney is a 1.7 cm exophytic lesion measuring 29 Hounsfield units, image 39/2. This does not meet criteria for a simple cyst. Stomach/Bowel: Stomach is within normal limits. Appendix appears normal. No evidence of bowel wall thickening, distention, or inflammatory changes. Sigmoid diverticulosis without signs of acute diverticulitis.  Vascular/Lymphatic: Aortic atherosclerosis. No signs of abdominopelvic adenopathy. Reproductive: Prostate is unremarkable. Other: Trace fluid noted within the dependent portion of the pelvis. Musculoskeletal: Spondylosis identified within the lumbar spine. No acute or suspicious osseous findings. IMPRESSION: 1. Right-sided hydronephrosis and hydroureter secondary to a 2-3 mm right UVJ calculus. 2. Nonobstructing left nephrolithiasis. 3. Indeterminate 1.7 cm exophytic lesion arising off the lateral cortex of the left kidney. This does not meet criteria for a simple cyst. When the patient is clinically stable and able to follow directions and hold their breath (preferably as an outpatient) further evaluation with dedicated abdominal MRI without and with contrast material should be considered. 4. Cardiomegaly with small pericardial effusion. 5. Sigmoid diverticulosis without signs of acute diverticulitis. 6. Aortic Atherosclerosis (ICD10-I70.0). Electronically Signed   By: Kerby Moors M.D.   On: 12/27/2021 06:47   US BIOPSY (KIDNEY)  Result Date: 01/01/2022 INDICATION: Proteinuria, renal failure EXAM: ULTRASOUND LEFT RENAL CORE BIOPSY MEDICATIONS: 1% LIDOCAINE LOCAL ANESTHESIA/SEDATION: Moderate (conscious) sedation was employed during this procedure. A total of Versed 1.0 mg and Fentanyl 50 mcg was administered intravenously by the radiology nurse. Total intra-service moderate Sedation Time: 15 minutes. The patient's level of consciousness and vital signs were monitored continuously by radiology nursing throughout the procedure under my direct supervision. FLUOROSCOPY TIME:  Fluoroscopy Time: NONE. COMPLICATIONS: None immediate. PROCEDURE: Informed written consent was obtained from the patient after a thorough discussion of the procedural risks, benefits and alternatives. All questions were addressed. Maximal Sterile Barrier Technique was utilized including caps, mask, sterile gowns, sterile gloves, sterile  drape, hand hygiene and skin antiseptic. A timeout was performed prior to the initiation of the procedure. Previous imaging reviewed. Preliminary ultrasound performed. Left kidney lower pole was localized and marked for a posterior approach. Under sterile conditions and local anesthesia, the 15 gauge coaxial guide was advanced to the left kidney lower pole. 18 gauge core biopsies obtained of the left kidney lower pole cortex. Samples placed  on a saline moistened Telfa. Needle tract occluded with Gel-Foam. Postprocedure imaging demonstrates no large hematoma or fluid collection. Patient tolerated biopsy well. IMPRESSION: Successful ultrasound left kidney random core biopsy Electronically Signed   By: Jerilynn Mages.  Shick M.D.   On: 01/01/2022 10:26   (Echo, Carotid, EGD, Colonoscopy, ERCP)    Subjective: Pt denies any complaints    Discharge Exam: Vitals:   01/02/22 0422 01/02/22 0809  BP: 133/77 120/76  Pulse: 76 73  Resp: 16   Temp: 98.1 F (36.7 C) 98.1 F (36.7 C)  SpO2: 99% 98%   Vitals:   01/01/22 1005 01/01/22 2016 01/02/22 0422 01/02/22 0809  BP: 133/82 132/84 133/77 120/76  Pulse: 72 75 76 73  Resp: 16 20 16    Temp:  98.7 F (37.1 C) 98.1 F (36.7 C) 98.1 F (36.7 C)  TempSrc:  Oral    SpO2: 96% 97% 99% 98%  Weight:      Height:        General: Pt is alert, awake, not in acute distress Cardiovascular: S1/S2 +, no rubs, no gallops Respiratory: CTA bilaterally, no wheezing, no rhonchi Abdominal: Soft, NT, ND, bowel sounds + Extremities: no cyanosis    The results of significant diagnostics from this hospitalization (including imaging, microbiology, ancillary and laboratory) are listed below for reference.     Microbiology: Recent Results (from the past 240 hour(s))  Urine Culture     Status: None   Collection Time: 12/27/21  5:50 AM   Specimen: Urine, Clean Catch  Result Value Ref Range Status   Specimen Description   Final    URINE, CLEAN CATCH Performed at [facility], [address], [place], [place] [address]    Special Requests   Final    NONE Performed at [facility], [address] [place], [place] [address]    Culture   Final    NO GROWTH Performed at [facility], [place] [address] [place], [place] [address]    Report Status 12/28/2021 FINAL  Final  Resp Panel by RT-PCR (Flu A&B, Covid) Nasopharyngeal Swab     Status: None   Collection Time: 12/27/21  7:17 AM   Specimen: Nasopharyngeal Swab; Nasopharyngeal(NP) swabs in vial transport medium  Result Value Ref Range Status   SARS Coronavirus 2 by RT PCR NEGATIVE NEGATIVE Final    Comment: (NOTE) SARS-CoV-2 target nucleic acids are NOT DETECTED.  The SARS-CoV-2 RNA is generally detectable in upper respiratory specimens during the acute phase of infection. The lowest concentration of SARS-CoV-2 viral copies this assay can detect is 138 copies/mL. A negative result does not preclude SARS-Cov-2 infection and should not be used as the sole basis for treatment or other patient management decisions. A negative result may occur with  improper specimen collection/handling, submission of specimen other than nasopharyngeal swab, presence of viral mutation(s) within the areas targeted by this assay, and inadequate number of viral copies(<138 copies/mL). A negative result must be combined with clinical observations, patient history, and epidemiological information. The expected result is Negative.  Fact Sheet for Patients:  EntrepreneurPulse.com.au  Fact Sheet for Healthcare Providers:  IncredibleEmployment.be  This test is no t yet approved or cleared by the [place] FDA and  has been authorized for detection and/or diagnosis of SARS-CoV-2 by FDA under an Emergency Use Authorization (EUA). This EUA will remain  in effect (meaning this test can be used) for the duration of the COVID-19 declaration under Section 564(b)(1) of the  Act,  21 U.S.C.section 360bbb-3(b)(1), unless the authorization is terminated  or revoked sooner.       Influenza A by PCR NEGATIVE NEGATIVE Final   Influenza B by PCR NEGATIVE NEGATIVE Final    Comment: (NOTE) The Xpert Xpress SARS-CoV-2/FLU/RSV plus assay is intended as an aid in the diagnosis of influenza from Nasopharyngeal swab specimens and should not be used as a sole basis for treatment. Nasal washings and aspirates are unacceptable for Xpert Xpress SARS-CoV-2/FLU/RSV testing.  Fact Sheet for Patients: EntrepreneurPulse.com.au  Fact Sheet for Healthcare Providers: IncredibleEmployment.be  This test is not yet approved or cleared by the [place] FDA and has been authorized for detection and/or diagnosis of SARS-CoV-2 by FDA under an Emergency Use Authorization (EUA). This EUA will remain in effect (meaning this test can be used) for the duration of the COVID-19 declaration under Section 564(b)(1) of the Act, 21 U.S.C. section 360bbb-3(b)(1), unless the authorization is terminated or revoked.  Performed at [facility], [address] [place], [place] [address]      Labs: BNP (last 3 results) Recent Labs    12/27/21 0404  BNP 2,355.7*   Basic Metabolic Panel: Recent Labs  Lab 12/29/21 0512 12/30/21 0817 12/31/21 0748 01/01/22 0443 01/02/22 0927  NA 130* 136 134* 134* 138  K 5.3* 4.4 4.9 4.3 3.8  CL 105 104 105 104 104  CO2 18* 21* 23 22 26   GLUCOSE 213* 92 102* 108* 107*  BUN 62* 70* 64* 65* 64*  CREATININE 5.49* 4.93* 4.56* 4.33* 4.17*  CALCIUM 8.1* 8.3* 8.1* 8.4* 8.4*   Liver Function Tests: Recent Labs  Lab 12/27/21 0404  AST 19  ALT 19  ALKPHOS 52  BILITOT 0.6  PROT 6.6  ALBUMIN 3.1*   Recent Labs  Lab 12/27/21 0404  LIPASE 43   No results for input(s): AMMONIA in the last 168 hours. CBC: Recent Labs  Lab 12/27/21 0404 12/28/21 0440 12/29/21 0512 01/02/22 0927  WBC 12.6* 9.5  6.8 8.0  HGB 11.5* 10.9* 10.6* 10.2*  HCT 35.2* 33.3* 32.4* 31.5*  MCV 93.4 92.0 92.0 93.2  PLT 216 177 185 188   Cardiac Enzymes: No results for input(s): CKTOTAL, CKMB, CKMBINDEX, TROPONINI in the last 168 hours. BNP: Invalid input(s): POCBNP CBG: No results for input(s): GLUCAP in the last 168 hours. D-Dimer No results for input(s): DDIMER in the last 72 hours. Hgb A1c No results for input(s): HGBA1C in the last 72 hours. Lipid Profile No results for input(s): CHOL, HDL, LDLCALC, TRIG, CHOLHDL, LDLDIRECT in the last 72 hours. Thyroid function studies No results for input(s): TSH, T4TOTAL, T3FREE, THYROIDAB in the last 72 hours.  Invalid input(s): FREET3 Anemia work up No results for input(s): VITAMINB12, FOLATE, FERRITIN, TIBC, IRON, RETICCTPCT in the last 72 hours. Urinalysis    Component Value Date/Time   COLORURINE YELLOW (A) 12/27/2021 0550   APPEARANCEUR CLEAR (A) 12/27/2021 0550   LABSPEC 1.017 12/27/2021 0550   PHURINE 5.0 12/27/2021 0550   GLUCOSEU 50 (A) 12/27/2021 0550   HGBUR SMALL (A) 12/27/2021 0550   BILIRUBINUR NEGATIVE 12/27/2021 0550   KETONESUR 5 (A) 12/27/2021 0550   PROTEINUR >=300 (A) 12/27/2021 0550   NITRITE NEGATIVE 12/27/2021 0550   LEUKOCYTESUR NEGATIVE 12/27/2021 0550   Sepsis Labs Invalid input(s): PROCALCITONIN,  WBC,  LACTICIDVEN Microbiology Recent Results (from the past 240 hour(s))  Urine Culture     Status: None   Collection Time: 12/27/21  5:50 AM   Specimen: Urine, Clean Catch  Result Value  Ref Range Status   Specimen Description   Final    URINE, CLEAN CATCH Performed at [facility], [address], [place], [place] [address]    Special Requests   Final    NONE Performed at [facility], [address], [place], [place] [address]    Culture   Final    NO GROWTH Performed at [facility], [place] [address] [place], [place] [address]    Report Status 12/28/2021 FINAL  Final  Resp Panel by RT-PCR  (Flu A&B, Covid) Nasopharyngeal Swab     Status: None   Collection Time: 12/27/21  7:17 AM   Specimen: Nasopharyngeal Swab; Nasopharyngeal(NP) swabs in vial transport medium  Result Value Ref Range Status   SARS Coronavirus 2 by RT PCR NEGATIVE NEGATIVE Final    Comment: (NOTE) SARS-CoV-2 target nucleic acids are NOT DETECTED.  The SARS-CoV-2 RNA is generally detectable in upper respiratory specimens during the acute phase of infection. The lowest concentration of SARS-CoV-2 viral copies this assay can detect is 138 copies/mL. A negative result does not preclude SARS-Cov-2 infection and should not be used as the sole basis for treatment or other patient management decisions. A negative result may occur with  improper specimen collection/handling, submission of specimen other than nasopharyngeal swab, presence of viral mutation(s) within the areas targeted by this assay, and inadequate number of viral copies(<138 copies/mL). A negative result must be combined with clinical observations, patient history, and epidemiological information. The expected result is Negative.  Fact Sheet for Patients:  EntrepreneurPulse.com.au  Fact Sheet for Healthcare Providers:  IncredibleEmployment.be  This test is no t yet approved or cleared by the [place] FDA and  has been authorized for detection and/or diagnosis of SARS-CoV-2 by FDA under an Emergency Use Authorization (EUA). This EUA will remain  in effect (meaning this test can be used) for the duration of the COVID-19 declaration under Section 564(b)(1) of the Act, 21 U.S.C.section 360bbb-3(b)(1), unless the authorization is terminated  or revoked sooner.       Influenza A by PCR NEGATIVE NEGATIVE Final   Influenza B by PCR NEGATIVE NEGATIVE Final    Comment: (NOTE) The Xpert Xpress SARS-CoV-2/FLU/RSV plus assay is intended as an aid in the diagnosis of influenza from Nasopharyngeal swab specimens  and should not be used as a sole basis for treatment. Nasal washings and aspirates are unacceptable for Xpert Xpress SARS-CoV-2/FLU/RSV testing.  Fact Sheet for Patients: EntrepreneurPulse.com.au  Fact Sheet for Healthcare Providers: IncredibleEmployment.be  This test is not yet approved or cleared by the [place] FDA and has been authorized for detection and/or diagnosis of SARS-CoV-2 by FDA under an Emergency Use Authorization (EUA). This EUA will remain in effect (meaning this test can be used) for the duration of the COVID-19 declaration under Section 564(b)(1) of the Act, 21 U.S.C. section 360bbb-3(b)(1), unless the authorization is terminated or revoked.  Performed at [facility], [address], [place], [place] [address]      Time coordinating discharge: Over 30 minutes  SIGNED:   Wyvonnia Dusky, MD  [facility] 01/02/2022, 12:37 PM Pager   If 7PM-7AM, please contact night-coverage

## 2022-01-02 NOTE — Progress Notes (Signed)
Patient discharged to home via self/private transport. Patient received discharge instructions with no questions or concerns. Patient medications sent to [facility] per request. Patient verified pharmacy received medications. PIVX1 removed. Patient expressed no concerns or complaints.

## 2022-01-09 ENCOUNTER — Telehealth: Payer: Self-pay | Admitting: Urology

## 2022-01-09 ENCOUNTER — Encounter: Payer: Self-pay | Admitting: Nephrology

## 2022-01-09 LAB — SURGICAL PATHOLOGY

## 2022-01-09 NOTE — Telephone Encounter (Signed)
I moved the patient's stent removal appt to 01/16/22 due to the issue with the graspers. He is having some pain from the stent in his lower groin area and said tylenol is not helping at all. He wants to know if he can get something or what he can do to help with the pain. He is aware that it may be tomorrow before we can call him back due to the late hour in the day. Please advise  Thanks, Sharyn Lull

## 2022-01-10 ENCOUNTER — Other Ambulatory Visit: Payer: Self-pay | Admitting: Urology

## 2022-01-10 ENCOUNTER — Encounter: Payer: Managed Care, Other (non HMO) | Admitting: Urology

## 2022-01-10 MED ORDER — HYDROCODONE-ACETAMINOPHEN 5-325 MG PO TABS: 1.0000 | ORAL_TABLET | Freq: Four times a day (QID) | ORAL | 0 refills | Status: AC | PRN

## 2022-01-10 NOTE — Telephone Encounter (Signed)
Should continue the Flomax, okay to refill if needed.  I also sent in [place] as needed.  Okay to send in oxybutynin 10 mg XL PRN bladder spasm if having spasm/urgency. He cannot take NSAIDs secondary to CKD.  Nickolas Madrid, MD 01/10/2022

## 2022-01-10 NOTE — Telephone Encounter (Signed)
Called pt informed him of the information below. Pt voiced understanding. Does not need re newel of Flomax. Pt states that his pain is much better with Norco.

## 2022-01-16 ENCOUNTER — Encounter: Payer: Self-pay | Admitting: Urology

## 2022-01-16 ENCOUNTER — Ambulatory Visit (INDEPENDENT_AMBULATORY_CARE_PROVIDER_SITE_OTHER): Payer: Managed Care, Other (non HMO) | Admitting: Urology

## 2022-01-16 ENCOUNTER — Other Ambulatory Visit: Payer: Self-pay

## 2022-01-16 VITALS — BP 111/67 | HR 66 | Ht 74.5 in | Wt 158.0 lb

## 2022-01-16 DIAGNOSIS — Z466 Encounter for fitting and adjustment of urinary device: Secondary | ICD-10-CM

## 2022-01-16 LAB — URINALYSIS, COMPLETE
Bilirubin, UA: NEGATIVE
Glucose, UA: NEGATIVE
Ketones, UA: NEGATIVE
Nitrite, UA: NEGATIVE
Specific Gravity, UA: 1.025 (ref 1.005–1.030)
Urobilinogen, Ur: 0.2 mg/dL (ref 0.2–1.0)
pH, UA: 6 (ref 5.0–7.5)

## 2022-01-16 LAB — MICROSCOPIC EXAMINATION: RBC, Urine: >30 /hpf — AB (ref 0–2)

## 2022-01-16 MED ORDER — CEPHALEXIN 250 MG PO CAPS: 500.0000 mg | ORAL_CAPSULE | Freq: Once | ORAL | Status: AC

## 2022-01-16 MED ORDER — LIDOCAINE HCL URETHRAL/MUCOSAL 2 % EX GEL
1.0000 "application " | Freq: Once | CUTANEOUS | Status: AC
  Administered 2022-01-16: 1 via URETHRAL

## 2022-01-16 NOTE — Progress Notes (Signed)
Cystoscopy Procedure Note:  Indication: Stent removal s/p 12/28/2021 ureteroscopy and basket extraction of right distal ureteral stone  Patient was admitted for AKI/CKD at that time and renal function improved only slightly after stone removal and stent placement, recently underwent renal biopsy showing TMA, chronic active tubular interstitial nephritis.  He continues to follow closely with nephrology.  On recent CT he also had a indeterminate 1.7 cm exophytic left lateral cyst, this was read as a simple cyst on a renal ultrasound from February 2021.  After informed consent and discussion of the procedure and its risks, Ricky Simmons was positioned and prepped in the standard fashion. Cystoscopy was performed with a flexible cystoscope. The stent was grasped with flexible graspers and removed in its entirety. The patient tolerated the procedure well.  Findings: Uncomplicated stent removal  Assessment and Plan: Continue nephrology follow-up RTC 6 months with urology with renal ultrasound for repeat imaging of left exophytic renal cyst   Billey Co, MD 01/16/2022

## 2022-01-16 NOTE — Patient Instructions (Signed)
Ureteral Stent Implantation, Care After This sheet gives you information about how to care for yourself after your procedure. Your health care provider may also give you more specific instructions. If you have problems or questions, contact your health care provider. What can I expect after the procedure? After the procedure, it is common to have: Nausea. Mild pain when you urinate. You may feel this pain in your lower back or lower abdomen. The pain should stop within a few minutes after you urinate. This may last for up to 1 week. A small amount of blood in your urine for several days. Follow these instructions at home: Medicines Take over-the-counter and prescription medicines only as told by your health care provider. If you were prescribed an antibiotic medicine, take it as told by your health care provider. Do not stop taking the antibiotic even if you start to feel better. Do not drive for 24 hours if you were given a sedative during your procedure. Ask your health care provider if the medicine prescribed to you requires you to avoid driving or using heavy machinery. Activity Rest as told by your health care provider. Avoid sitting for a long time without moving. Get up to take short walks every 1-2 hours. This is important to improve blood flow and breathing. Ask for help if you feel weak or unsteady. Return to your normal activities as told by your health care provider. Ask your health care provider what activities are safe for you. General instructions  Watch for any blood in your urine. Call your health care provider if the amount of blood in your urine increases. If you have a catheter: Follow instructions from your health care provider about taking care of your catheter and collection bag. Do not take baths, swim, or use a hot tub until your health care provider approves. Ask your health care provider if you may take showers. You may only be allowed to take sponge baths. Drink  enough fluid to keep your urine pale yellow. Do not use any products that contain nicotine or tobacco, such as cigarettes, e-cigarettes, and chewing tobacco. These can delay healing after surgery. If you need help quitting, ask your health care provider. Keep all follow-up visits as told by your health care provider. This is important. Contact a health care provider if: You have pain that gets worse or does not get better with medicine, especially pain when you urinate. You have difficulty urinating. You feel nauseous or you vomit repeatedly during a period of more than 2 days after the procedure. Get help right away if: Your urine is dark red or has blood clots in it. You are leaking urine (have incontinence). The end of the stent comes out of your urethra. You cannot urinate. You have sudden, sharp, or severe pain in your abdomen or lower back. You have a fever. You have swelling or pain in your legs. You have difficulty breathing. Summary After the procedure, it is common to have mild pain when you urinate that goes away within a few minutes after you urinate. This may last for up to 1 week. Watch for any blood in your urine. Call your health care provider if the amount of blood in your urine increases. Take over-the-counter and prescription medicines only as told by your health care provider. Drink enough fluid to keep your urine pale yellow. This information is not intended to replace advice given to you by your health care provider. Make sure you discuss any questions you have  with your health care provider. Document Revised: 09/01/2018 Document Reviewed: 09/02/2018 Elsevier Patient Education  2022 [employer].

## 2022-05-31 IMAGING — CR DG CHEST 2V
3 series · 3 of 3 positions shown · non-contrast
Comparison: None.

CLINICAL DATA: 60-year-old male with shortness of breath. Right
flank pain and hematuria.

EXAM:
CHEST - 2 VIEW

[chest pa]
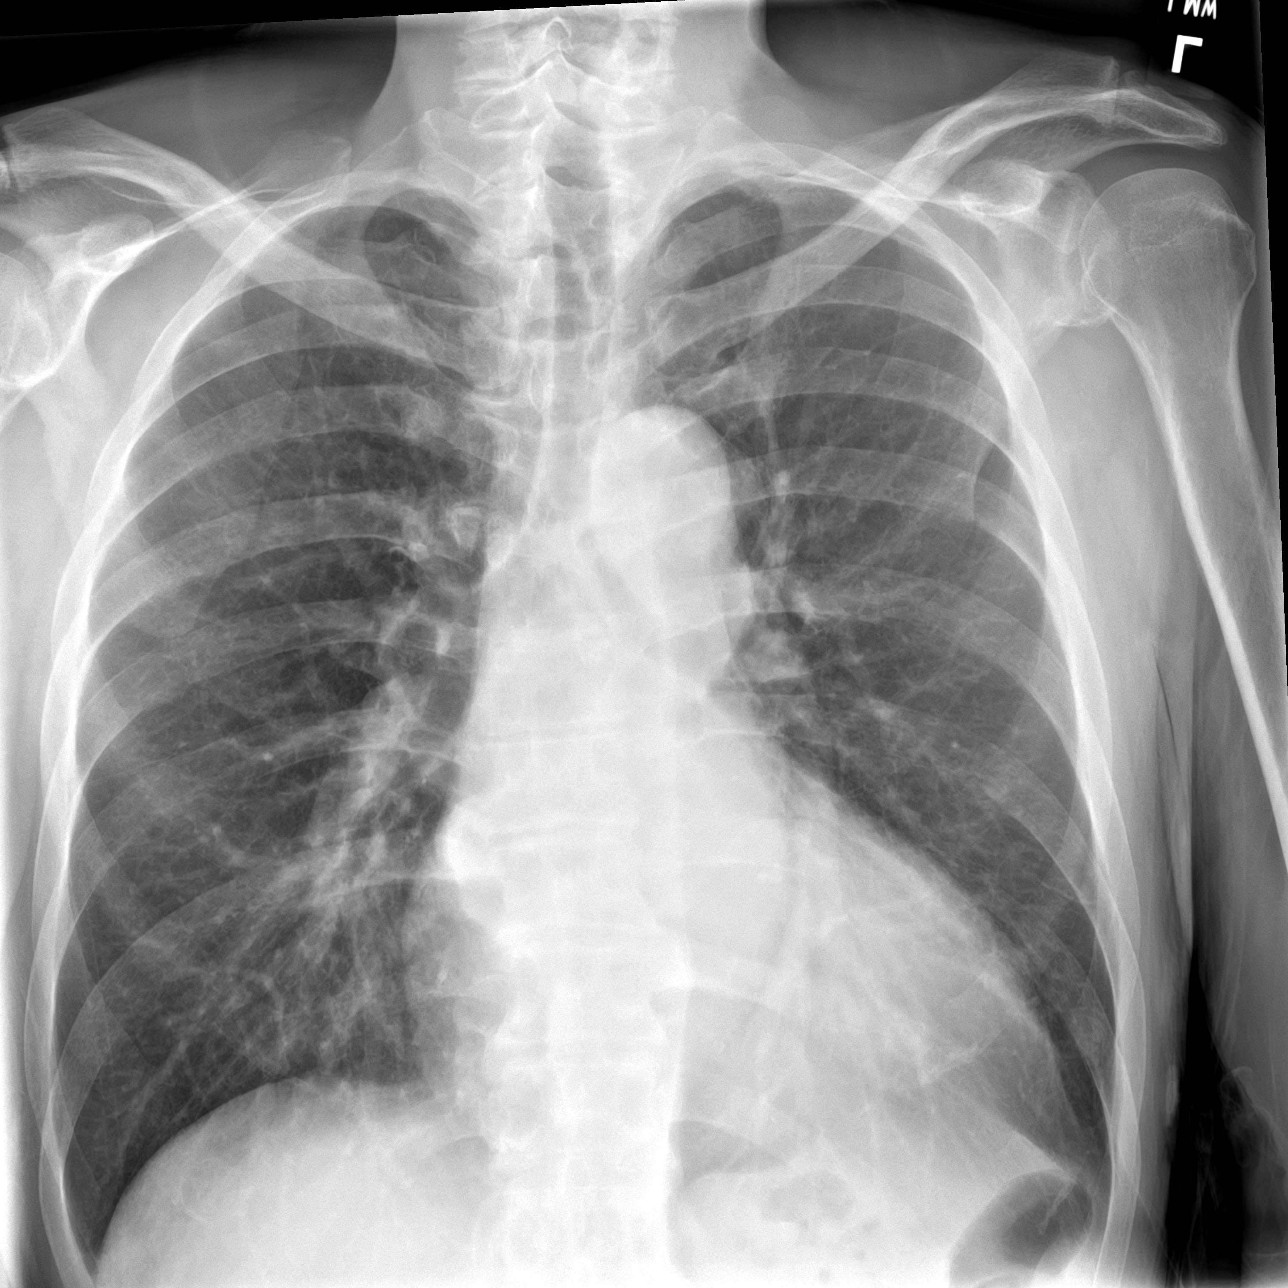

[chest lat (1 of 2)]
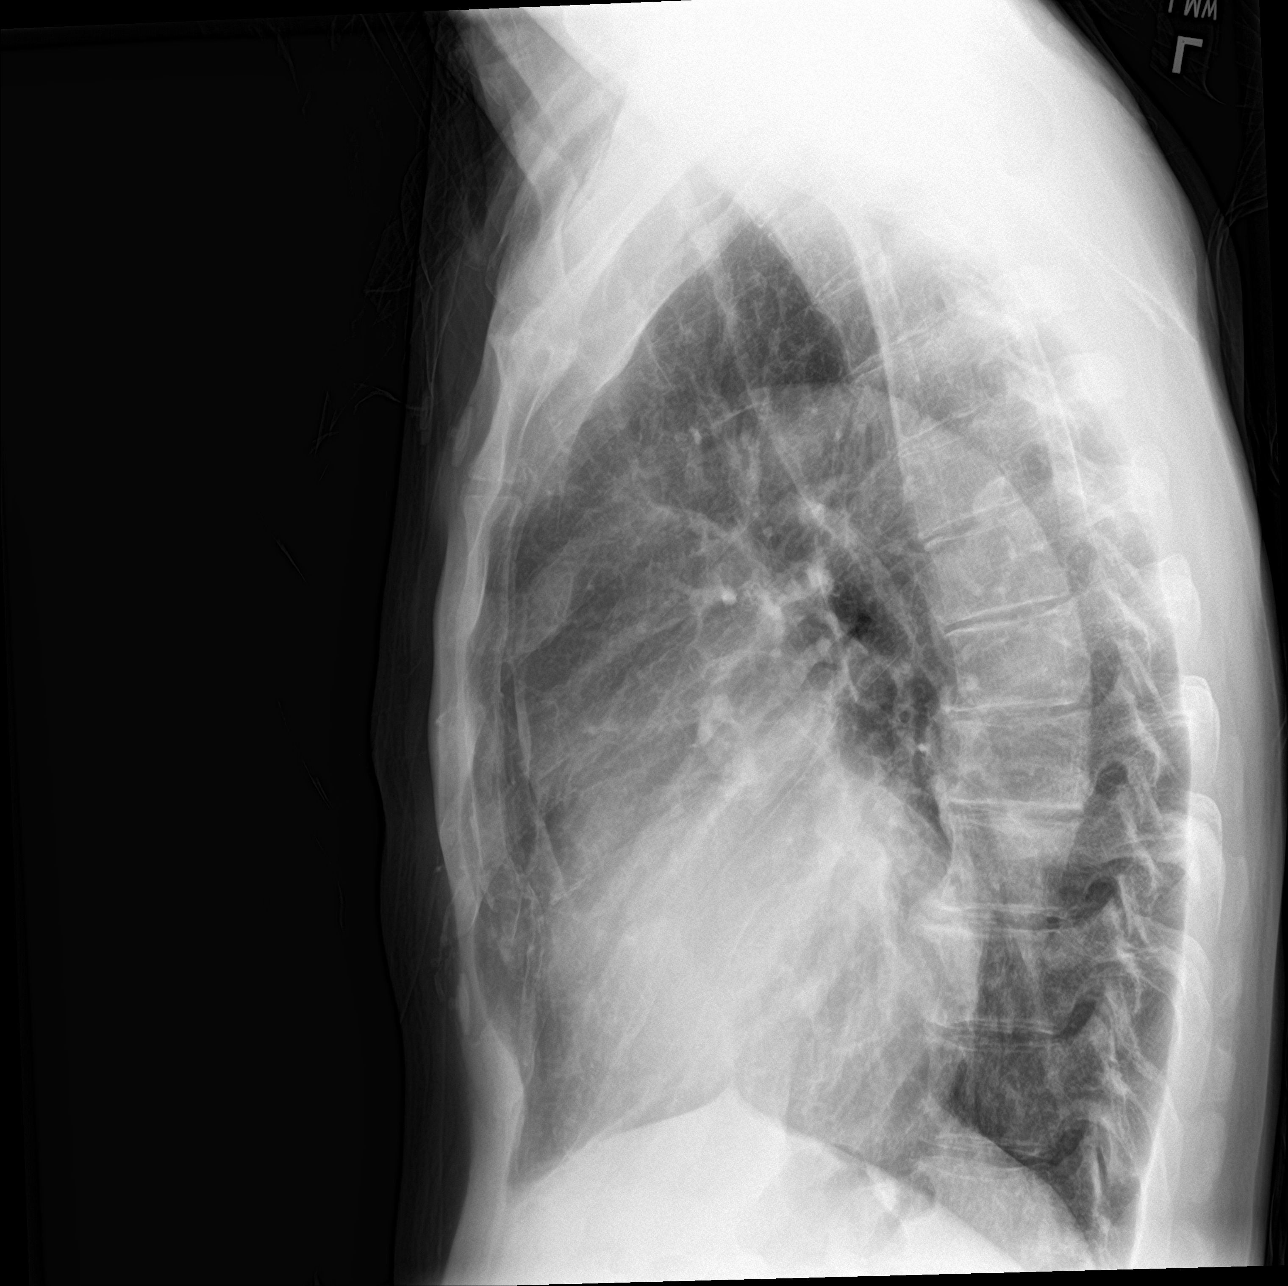

[chest lat (2 of 2)]
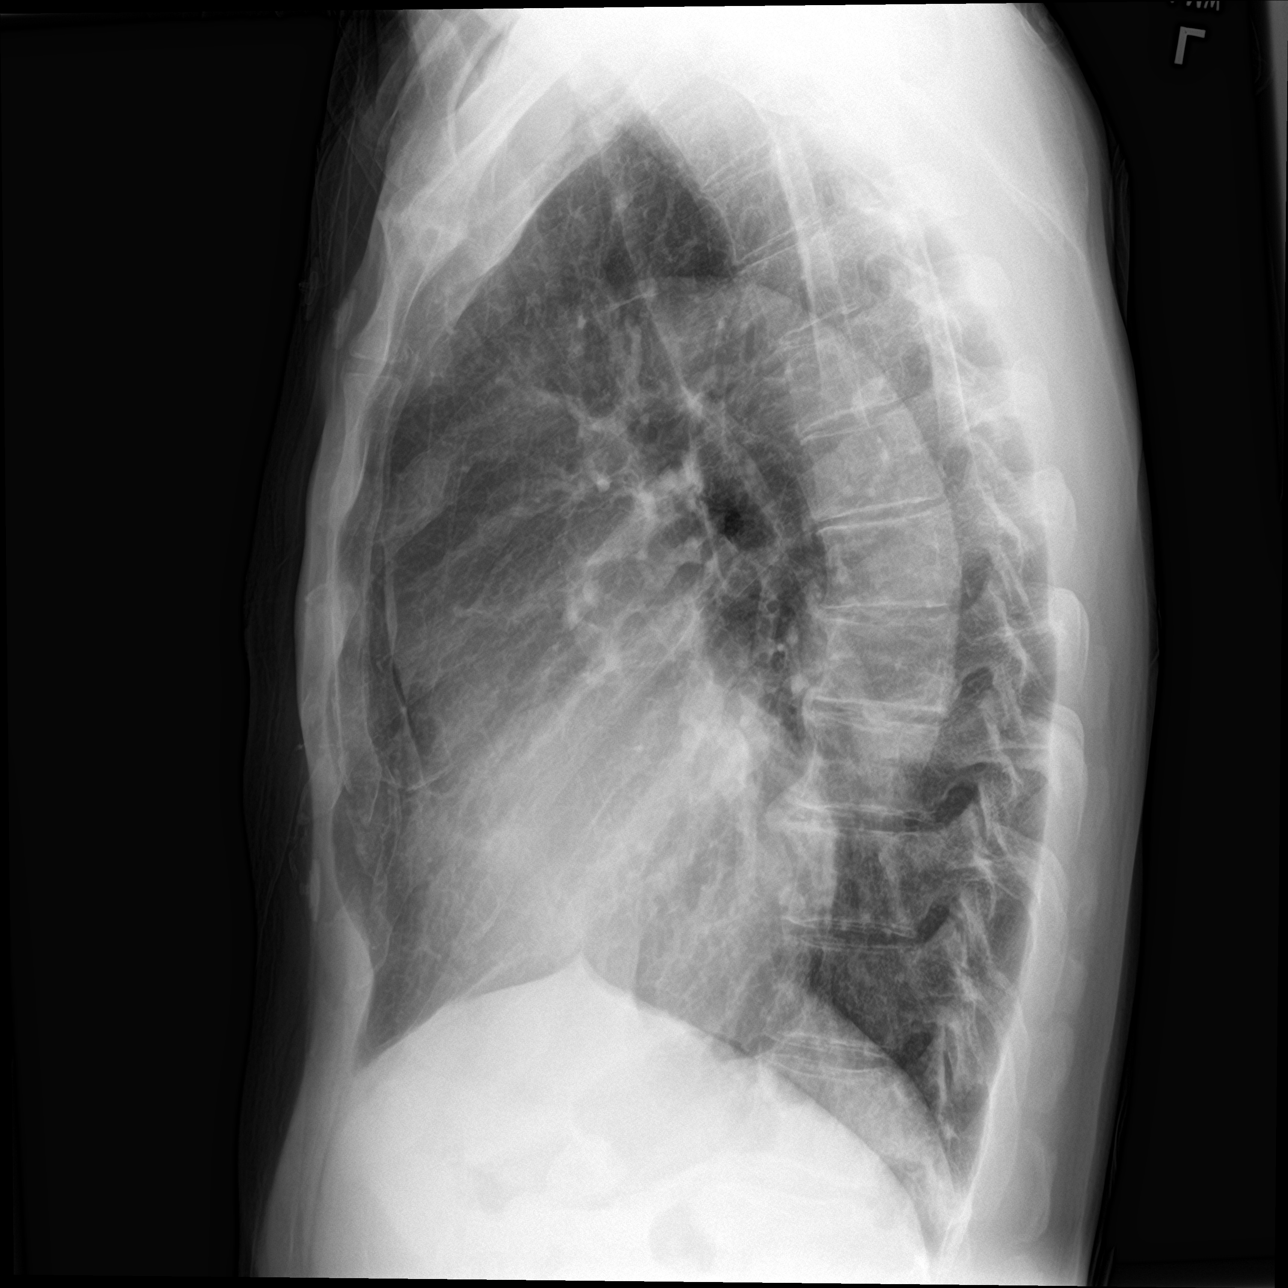

[3 of 3 positions shown; findings below may reference images not displayed]

FINDINGS: Cardiomegaly. Mildly tortuous thoracic aorta. Other mediastinal
contours are within normal limits. Visualized tracheal air column is
within normal limits. Normal lung volumes. No pneumothorax,
pulmonary edema, pleural effusion or confluent pulmonary opacity.

No acute osseous abnormality identified. Negative visible bowel gas.
IMPRESSION: Cardiomegaly.  No acute cardiopulmonary abnormality.

## 2022-05-31 IMAGING — CT CT RENAL STONE PROTOCOL
2 of 4 series · 15 of 46 positions shown, 17 images · non-contrast
Comparison: None.

CLINICAL DATA: Flank pain.  Evaluate for kidney stone



[Series 2: stone full standard · axial · 0.75mm/px · z∈[-724,-294]mm · 12 of 96 slices shown, 14 images]
[im 5/96  soft-tissue]
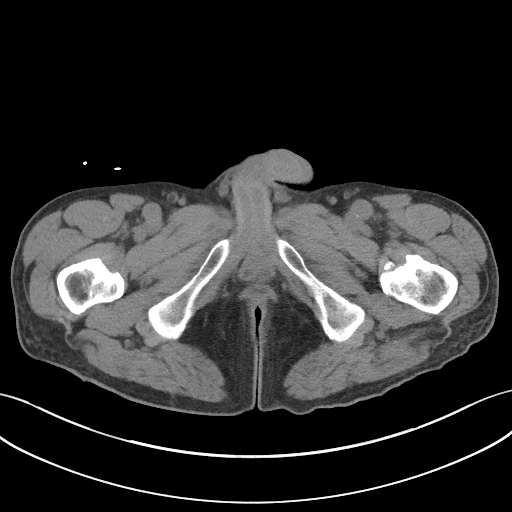
[im 5/96  bone]
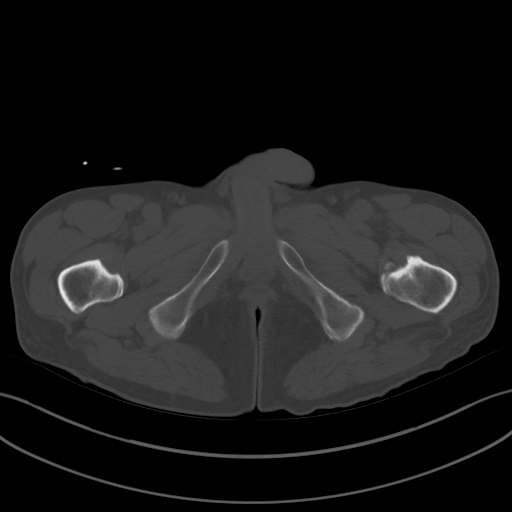
[im 13/96  soft-tissue]
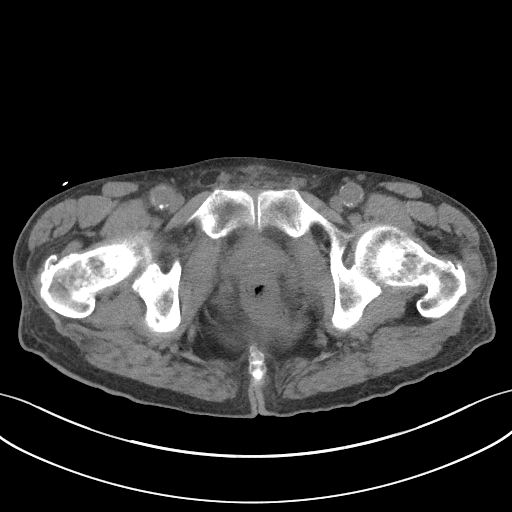
[im 22/96  soft-tissue]
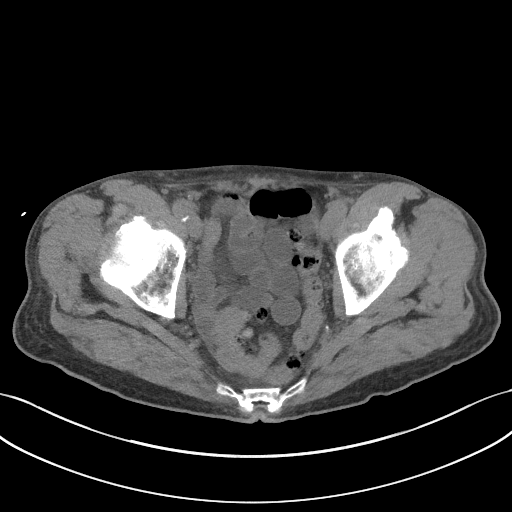
[im 31/96  soft-tissue]
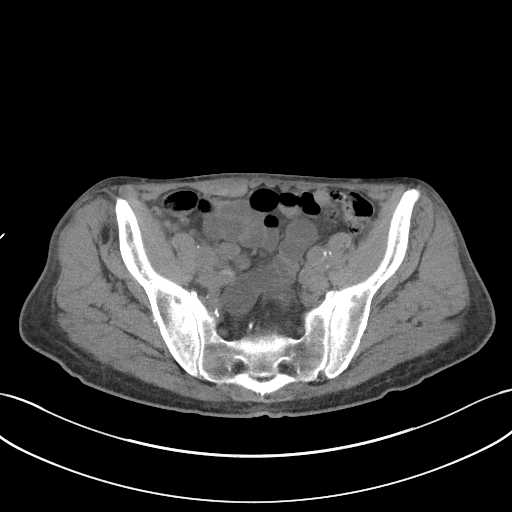
[im 35/96  soft-tissue]
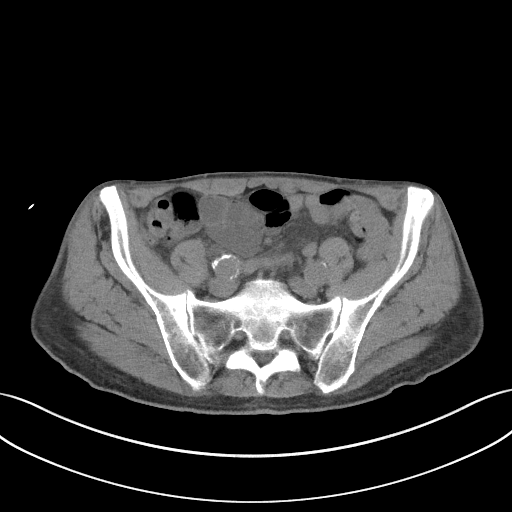
[im 44/96  soft-tissue]
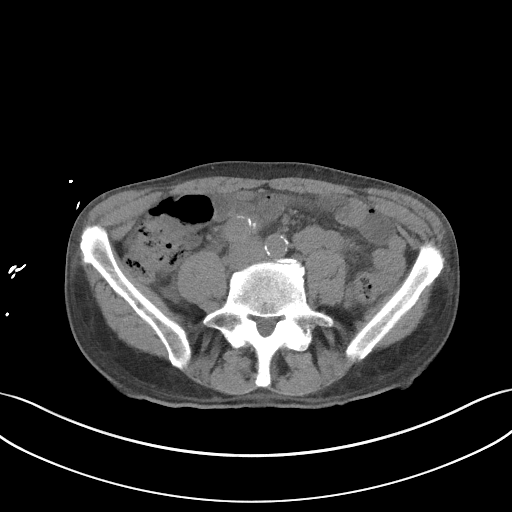
[im 52/96  soft-tissue]
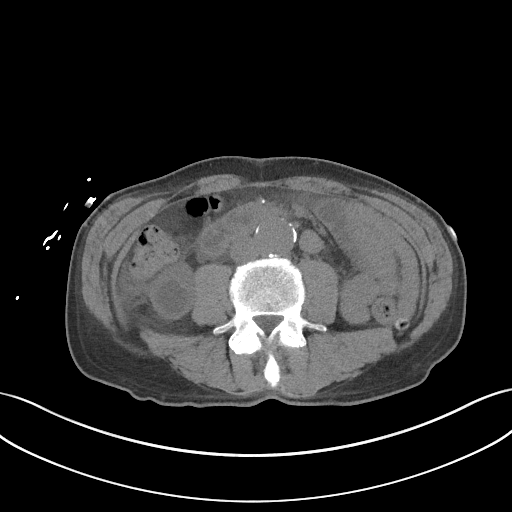
[im 61/96  soft-tissue]
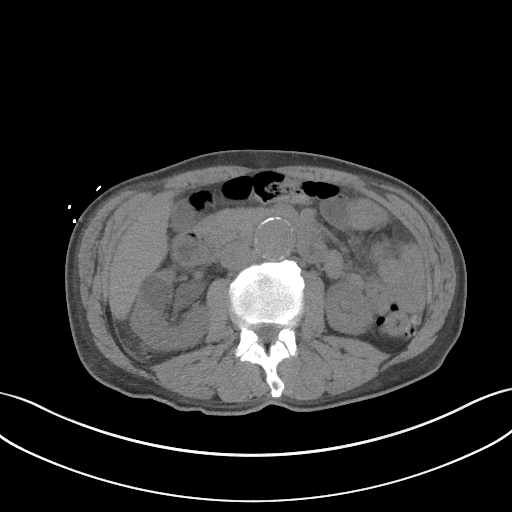
[im 65/96  soft-tissue]
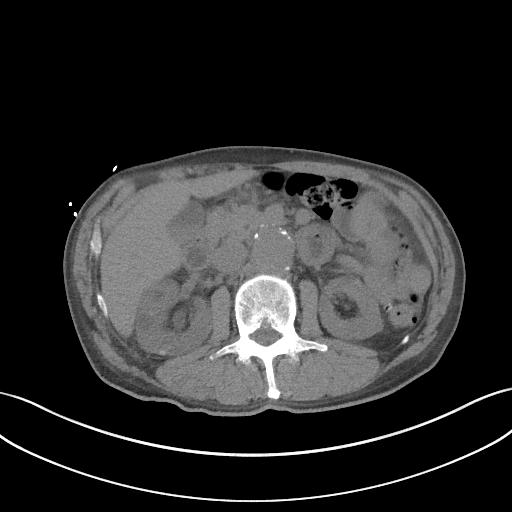
[im 65/96  bone]
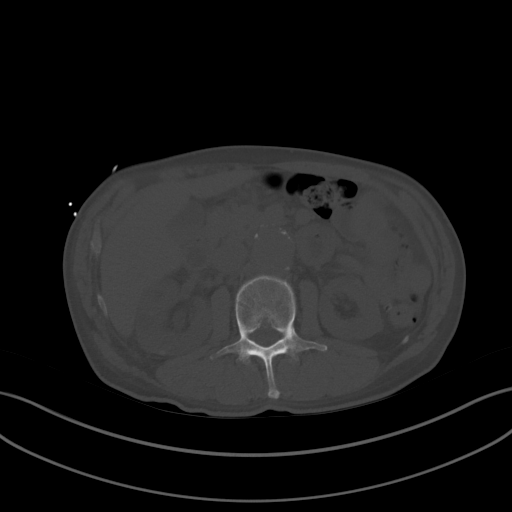
[im 74/96  soft-tissue]
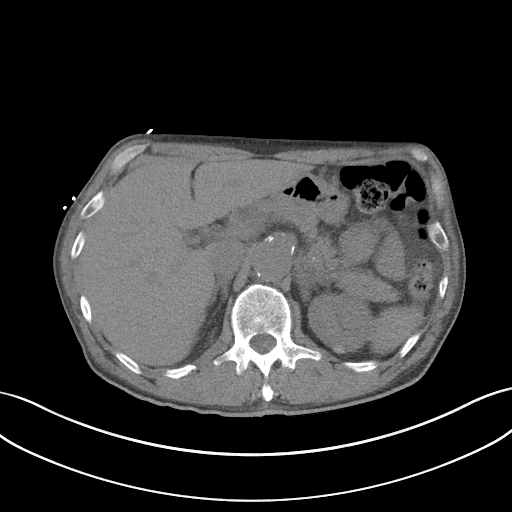
[im 83/96  soft-tissue]
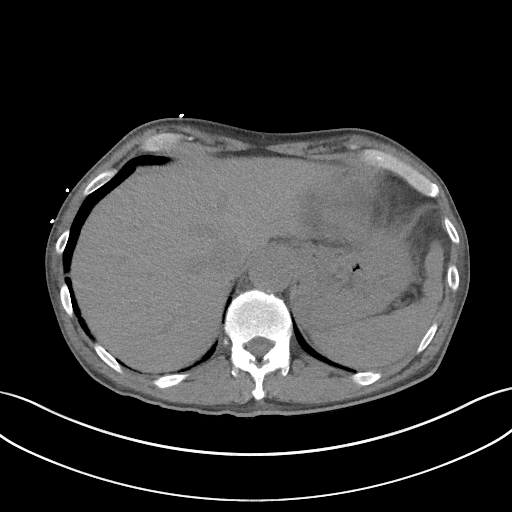
[im 91/96  soft-tissue]
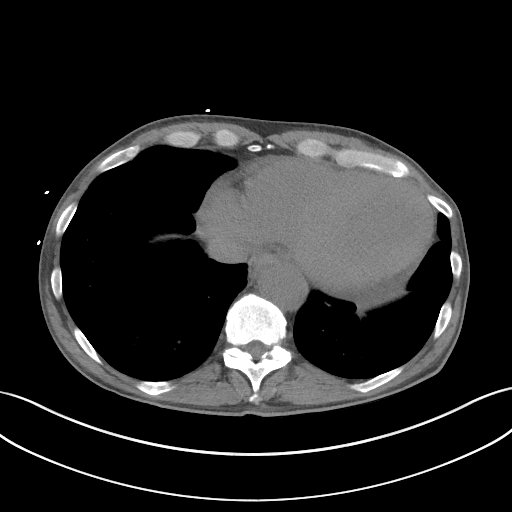

[Series 5: coronal · coronal · 0.73mm/px · 3 of 121 slices shown]
[im 41/121  soft-tissue]
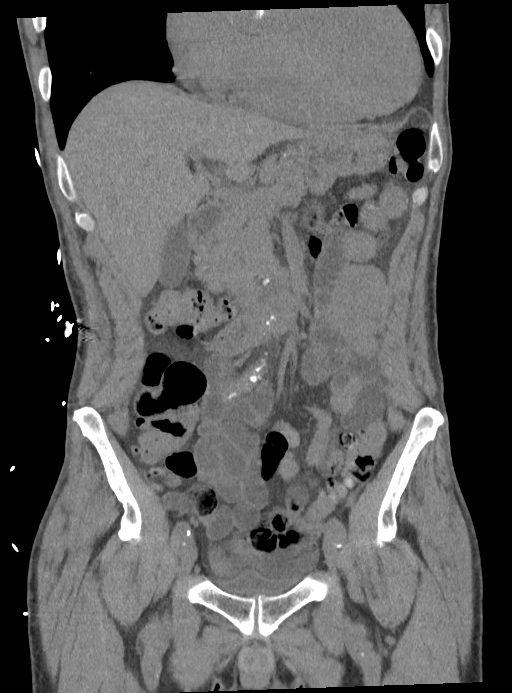
[im 54/121  soft-tissue]
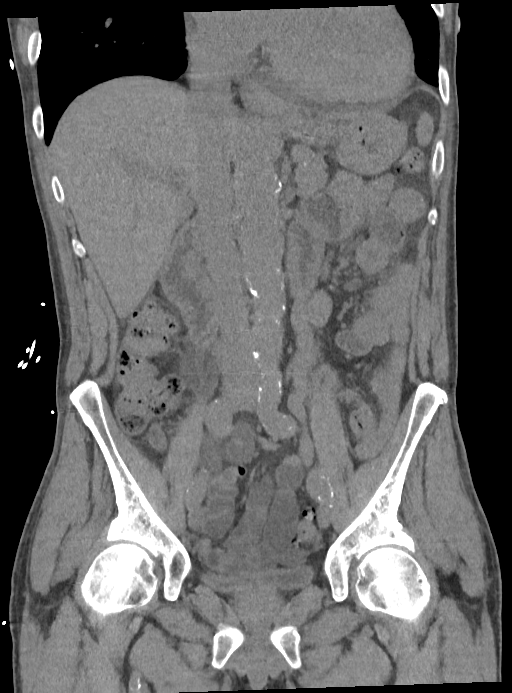
[im 67/121  soft-tissue]
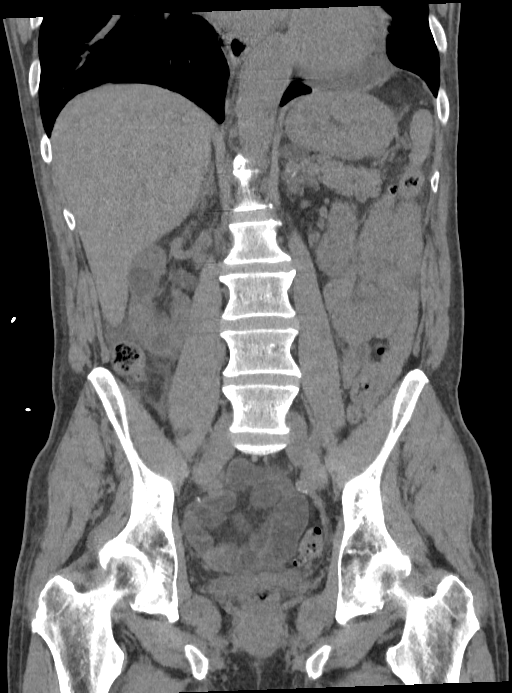

[15 of 46 positions shown; findings below may reference images not displayed]

FINDINGS: Lower chest: Heart size is enlarged. No pleural effusion or edema.
Small pericardial effusion noted.

Hepatobiliary: No focal liver abnormality is seen. No gallstones,
gallbladder wall thickening, or biliary dilatation.

Pancreas: Unremarkable. No pancreatic ductal dilatation or
surrounding inflammatory changes.

Spleen: Normal in size without focal abnormality.

Adrenals/Urinary Tract: Normal adrenal glands. There is a tiny stone
within the right UVJ measuring 2-3 mm, image 80/2. This results in
mild right hydronephrosis and hydroureter with perinephric fat
stranding. Several punctate stones are identified within the upper
pole the left kidney. No left-sided hydronephrosis.

-Within the upper pole of the right kidney there is a 2.5 cm,
Hounsfield units, image 36/2. This is compatible with a benign cyst.

-Arising off the lateral cortex of the left kidney is a 1.7 cm
exophytic lesion measuring 29 Hounsfield units, image 39/2. This
does not meet criteria for a simple cyst.

Stomach/Bowel: Stomach is within normal limits. Appendix appears
normal. No evidence of bowel wall thickening, distention, or
inflammatory changes. Sigmoid diverticulosis without signs of acute
diverticulitis.

Vascular/Lymphatic: Aortic atherosclerosis. No signs of
abdominopelvic adenopathy.

Reproductive: Prostate is unremarkable.

Other: Trace fluid noted within the dependent portion of the pelvis.

Musculoskeletal: Spondylosis identified within the lumbar spine. No
acute or suspicious osseous findings.
IMPRESSION: 1. Right-sided hydronephrosis and hydroureter secondary to a 2-3 mm
right UVJ calculus.
2. Nonobstructing left nephrolithiasis.
3. Indeterminate 1.7 cm exophytic lesion arising off the lateral
cortex of the left kidney. This does not meet criteria for a simple
cyst. When the patient is clinically stable and able to follow
directions and hold their breath (preferably as an outpatient)
further evaluation with dedicated abdominal MRI without and with
contrast material should be considered.
4. Cardiomegaly with small pericardial effusion.
5. Sigmoid diverticulosis without signs of acute diverticulitis.
6. Aortic Atherosclerosis ([id number]).

## 2022-06-05 IMAGING — US US BIOPSY
1 series · 11 of 11 positions shown · non-contrast
Comparison: none

INDICATION: Proteinuria, renal failure

[Series 1: us biopsy · 0.20mm/px · 11 of 11 slices shown]
[im 1/11]
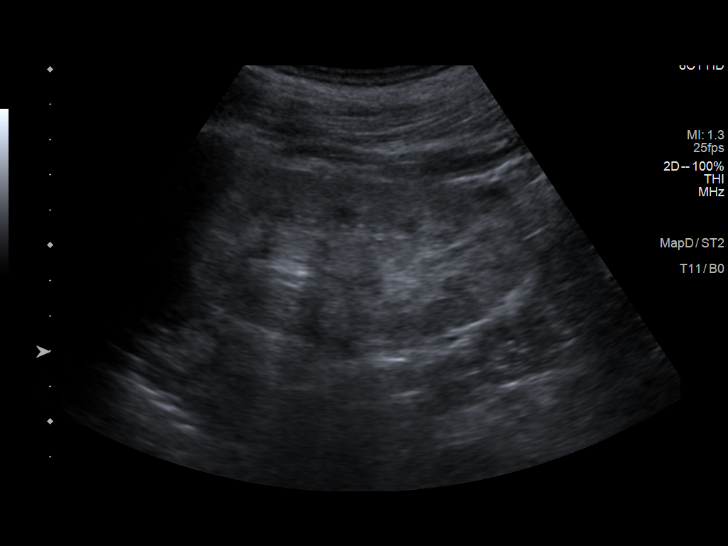
[im 2/11]
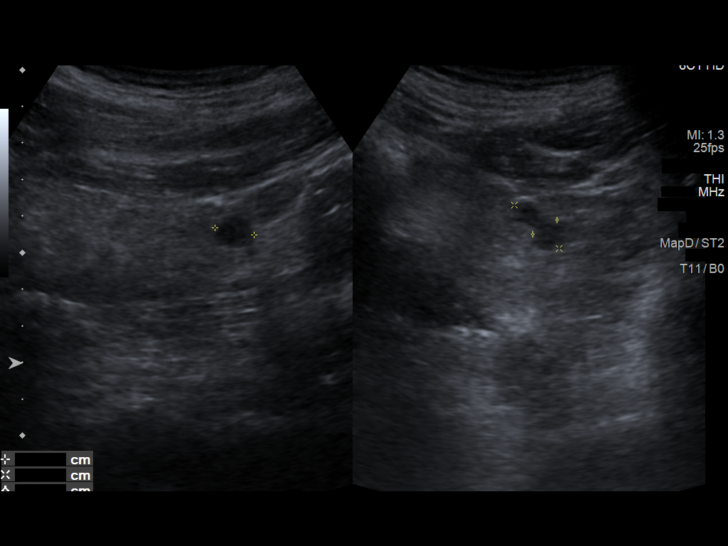
[im 3/11]
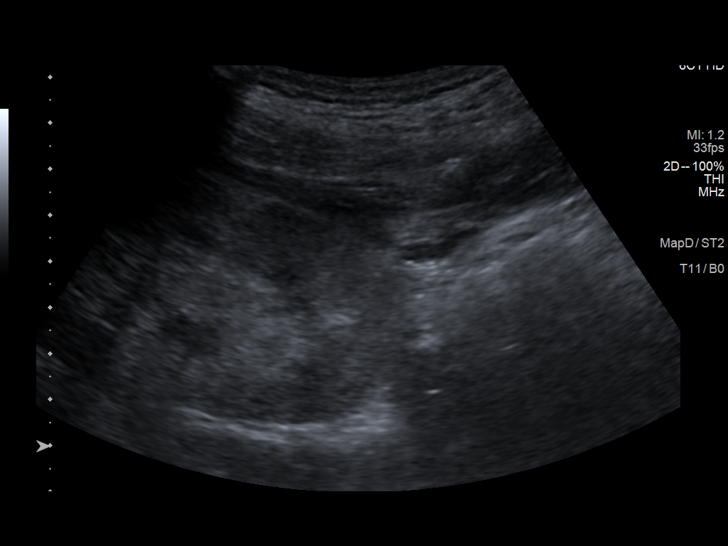
[im 4/11]
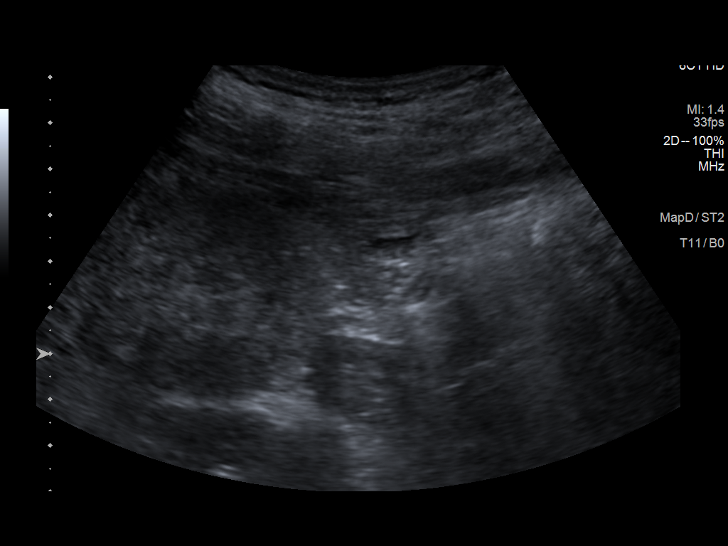
[im 5/11]
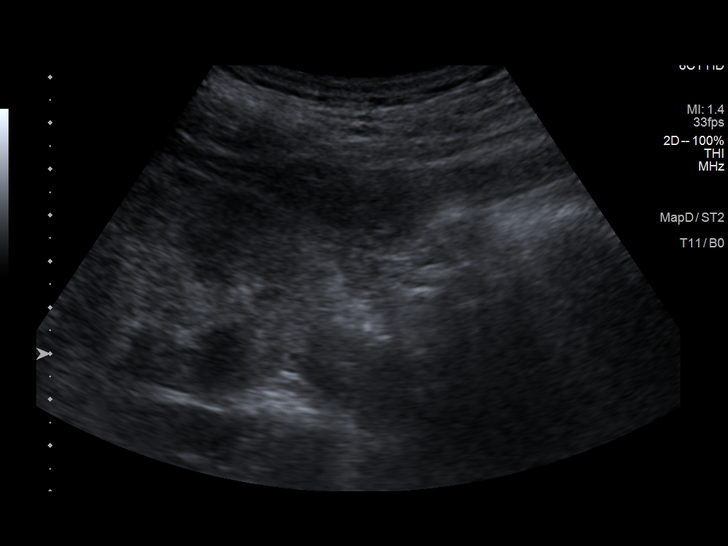
[im 6/11]
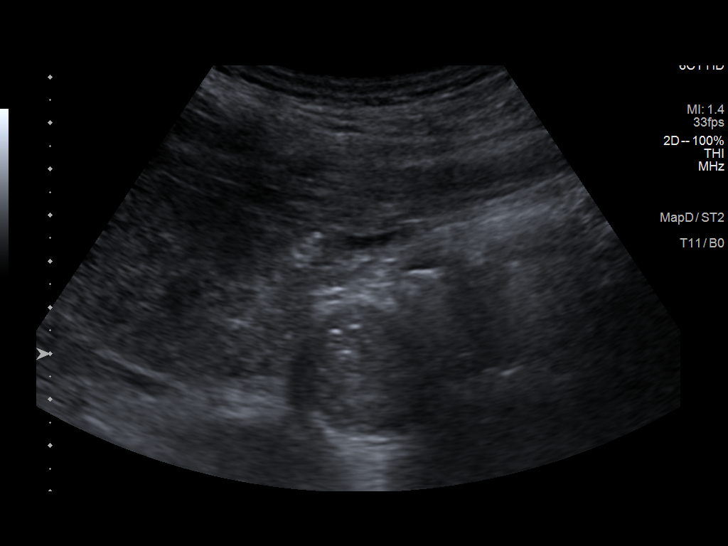
[im 7/11]
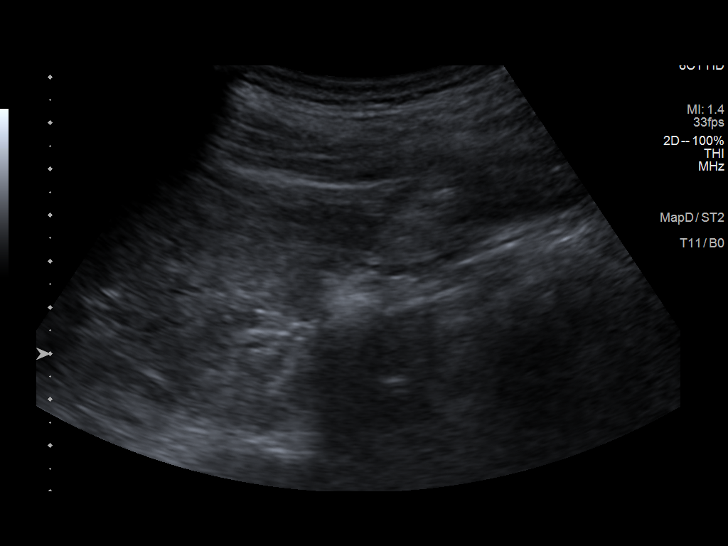
[im 8/11]
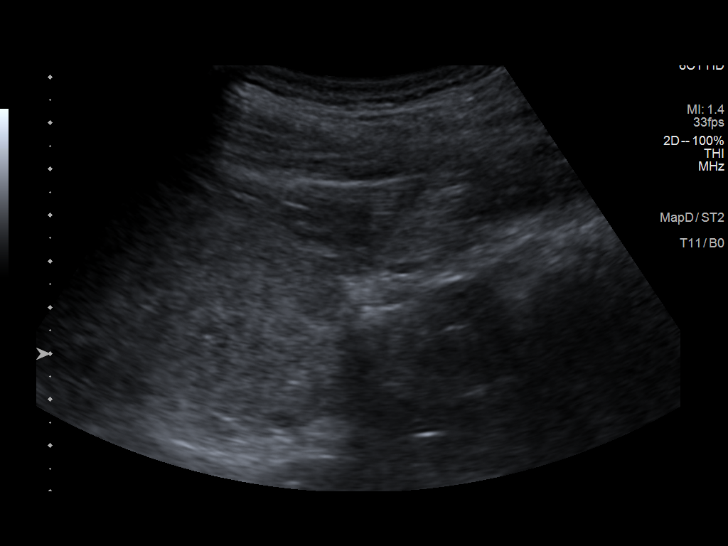
[im 9/11]
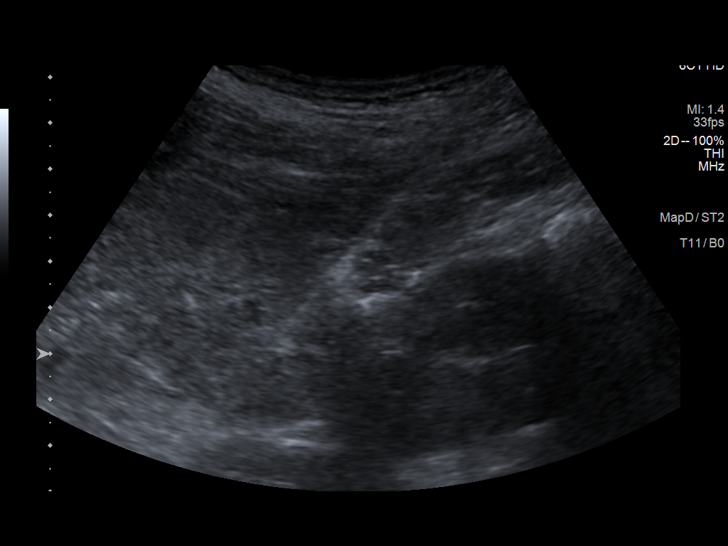
[im 10/11]
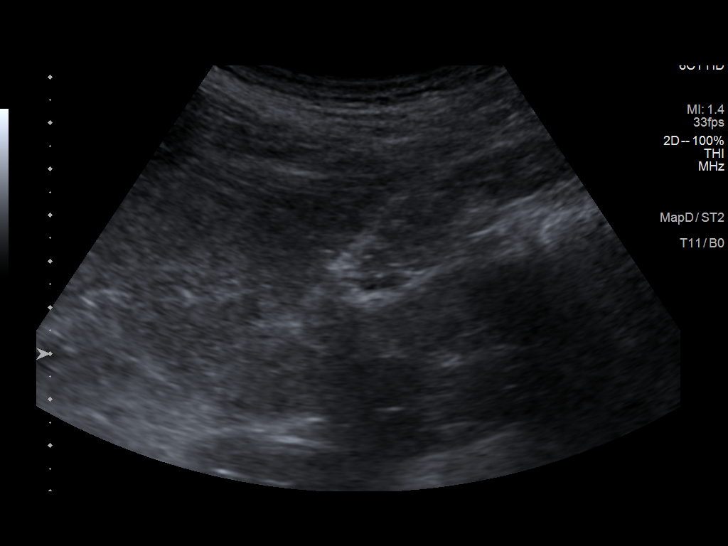
[im 11/11]
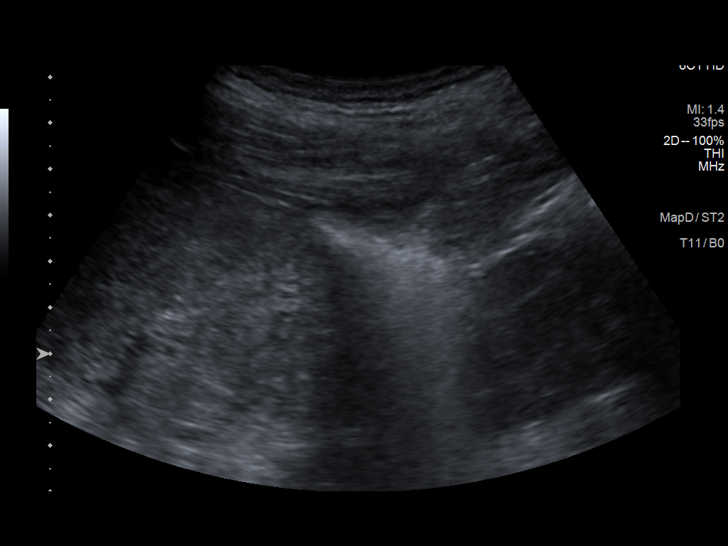

[11 of 11 positions shown; findings below may reference images not displayed]

EXAM:
ULTRASOUND LEFT RENAL CORE BIOPSY

MEDICATIONS:
1% LIDOCAINE LOCAL

ANESTHESIA/SEDATION:
Moderate (conscious) sedation was employed during this procedure. A
total of Versed 1.0 mg and Fentanyl 50 mcg was administered
intravenously by the radiology nurse.

Total intra-service moderate Sedation Time: 15 minutes. The
patient's level of consciousness and vital signs were monitored
continuously by radiology nursing throughout the procedure under my
direct supervision.

FLUOROSCOPY TIME:  Fluoroscopy Time: NONE.

COMPLICATIONS:
None immediate.

PROCEDURE:
Informed written consent was obtained from the patient after a
thorough discussion of the procedural risks, benefits and
alternatives. All questions were addressed. Maximal Sterile Barrier
Technique was utilized including caps, mask, sterile gowns, sterile
gloves, sterile drape, hand hygiene and skin antiseptic. A timeout
was performed prior to the initiation of the procedure.

Previous imaging reviewed. Preliminary ultrasound performed. Left
kidney lower pole was localized and marked for a posterior approach.

Under sterile conditions and local anesthesia, the 15 gauge coaxial
guide was advanced to the left kidney lower pole. 18 gauge core
biopsies obtained of the left kidney lower pole cortex. Samples
placed on a saline moistened Telfa. Needle tract occluded with
Gel-Foam. Postprocedure imaging demonstrates no large hematoma or
fluid collection. Patient tolerated biopsy well.
IMPRESSION: Successful ultrasound left kidney random core biopsy

## 2022-07-16 ENCOUNTER — Other Ambulatory Visit: Payer: Self-pay | Admitting: Urology

## 2022-07-16 ENCOUNTER — Telehealth: Payer: Self-pay | Admitting: Urology

## 2022-07-16 DIAGNOSIS — N200 Calculus of kidney: Secondary | ICD-10-CM

## 2022-07-16 DIAGNOSIS — N281 Cyst of kidney, acquired: Secondary | ICD-10-CM

## 2022-07-16 NOTE — Telephone Encounter (Signed)
LMOM to let pt know appt was canceled and he would need u/s prior to r/s w/[facility]

## 2022-07-17 ENCOUNTER — Ambulatory Visit: Payer: Managed Care, Other (non HMO) | Admitting: Urology

## 2022-08-05 ENCOUNTER — Ambulatory Visit: Payer: Managed Care, Other (non HMO)

## 2022-08-14 ENCOUNTER — Ambulatory Visit
Admission: RE | Admit: 2022-08-14 | Discharge: 2022-08-14 | Disposition: A | Discharge status: Home / Self Care | Payer: Managed Care, Other (non HMO) | Source: Ambulatory Visit | Attending: Urology | Admitting: Urology

## 2022-08-14 DIAGNOSIS — N200 Calculus of kidney: Secondary | ICD-10-CM | POA: Diagnosis present

## 2022-08-14 DIAGNOSIS — N281 Cyst of kidney, acquired: Secondary | ICD-10-CM | POA: Diagnosis not present

## 2022-08-15 ENCOUNTER — Telehealth: Payer: Self-pay

## 2022-08-15 DIAGNOSIS — N2889 Other specified disorders of kidney and ureter: Secondary | ICD-10-CM

## 2022-08-15 NOTE — Telephone Encounter (Signed)
-----   Message from Billey Co, MD sent at 08/15/2022 12:41 PM EDT ----- Kidney ultrasound does not show any blockage, however there is some possible abnormal tissue within the left kidney, and radiology recommends a MRI for further evaluation.  Please order MR abdomen with and without contrast for renal mass, and follow-up in person to review results  Thanks Nickolas Madrid, MD 08/15/2022

## 2022-08-15 NOTE — Telephone Encounter (Signed)
Called pt informed him of the information below. Pt voiced understanding. MRI ordered, appt scheduled.

## 2022-08-16 NOTE — Addendum Note (Signed)
Addended by: Zara Council A on: 08/16/2022 10:30 AM   Modules accepted: Orders

## 2022-08-16 NOTE — Addendum Note (Signed)
Addended by: Zara Council A on: 08/16/2022 10:24 AM   Modules accepted: Orders

## 2022-08-16 NOTE — Addendum Note (Signed)
Addended by: Zara Council A on: 08/16/2022 10:22 AM   Modules accepted: Orders

## 2022-08-29 ENCOUNTER — Ambulatory Visit: Payer: Managed Care, Other (non HMO) | Admitting: Urology

## 2022-09-10 ENCOUNTER — Ambulatory Visit
Admission: RE | Admit: 2022-09-10 | Discharge: 2022-09-10 | Disposition: A | Discharge status: Home / Self Care | Payer: Managed Care, Other (non HMO) | Source: Ambulatory Visit | Attending: Urology | Admitting: Urology

## 2022-09-10 DIAGNOSIS — N2889 Other specified disorders of kidney and ureter: Secondary | ICD-10-CM

## 2022-09-10 MED ORDER — GADOBENATE DIMEGLUMINE 529 MG/ML IV SOLN
15.0000 mL | Freq: Once | INTRAVENOUS | Status: AC | PRN
  Administered 2022-09-10: 15 mL via INTRAVENOUS

## 2022-09-12 ENCOUNTER — Ambulatory Visit (INDEPENDENT_AMBULATORY_CARE_PROVIDER_SITE_OTHER): Payer: Managed Care, Other (non HMO) | Admitting: Urology

## 2022-09-12 VITALS — BP 152/93 | HR 67 | Ht 74.0 in | Wt 175.0 lb

## 2022-09-12 DIAGNOSIS — N281 Cyst of kidney, acquired: Secondary | ICD-10-CM | POA: Diagnosis not present

## 2022-09-12 DIAGNOSIS — N19 Unspecified kidney failure: Secondary | ICD-10-CM

## 2022-09-12 DIAGNOSIS — N200 Calculus of kidney: Secondary | ICD-10-CM

## 2022-09-12 NOTE — Progress Notes (Signed)
   09/12/2022 3:17 PM   Kamaron Deskins Bolla 1961-09-10 [id number]  Reason for visit: Follow up nephrolithiasis, renal failure, renal lesions  HPI: 61 year old male who presented in January 2023 with a 3 mm right distal ureteral stone and renal failure.  He ultimately underwent right ureteroscopy, basket extraction, and stent placement, and stent was removed a few weeks later.  He had persistent renal failure and underwent a renal biopsy showing TMA, chronic active tubular interstitial nephritis.  He is followed closely by nephrology, most recent creatinine 4.52, EGFR 14.  He was referred to [facility] for consideration of renal transplant.  I reviewed the outside notes from nephrology.  A follow-up renal ultrasound was performed in early September 2023 which showed possible left-sided renal mass, an MRI was recommended for further evaluation.  I personally viewed and interpreted the MRI dated 09/10/2022 that shows multiple small Bosniak 1 and 2 cysts, no solid masses, no cyst that require imaging follow-up, no hydronephrosis.  There is also a 1 cm simple appearing cystic lesion in the pancreatic head, and follow-up MRI in 1 year was recommended.  RTC with urology 1 year PVR  Billey Co, MD  [facility] [address], [place], [place] [address] [phone]

## 2022-11-15 ENCOUNTER — Other Ambulatory Visit: Payer: Self-pay | Admitting: Physician Assistant

## 2022-11-15 DIAGNOSIS — Z87891 Personal history of nicotine dependence: Secondary | ICD-10-CM

## 2022-11-15 DIAGNOSIS — N184 Chronic kidney disease, stage 4 (severe): Secondary | ICD-10-CM

## 2022-12-06 ENCOUNTER — Ambulatory Visit
Admission: RE | Admit: 2022-12-06 | Discharge: 2022-12-06 | Disposition: A | Discharge status: Home / Self Care | Payer: Managed Care, Other (non HMO) | Source: Ambulatory Visit | Attending: Physician Assistant | Admitting: Physician Assistant

## 2022-12-06 DIAGNOSIS — Z87891 Personal history of nicotine dependence: Secondary | ICD-10-CM | POA: Insufficient documentation

## 2022-12-06 DIAGNOSIS — N184 Chronic kidney disease, stage 4 (severe): Secondary | ICD-10-CM | POA: Diagnosis present

## 2023-01-21 ENCOUNTER — Other Ambulatory Visit: Payer: Self-pay | Admitting: Nephrology

## 2023-01-21 DIAGNOSIS — E875 Hyperkalemia: Secondary | ICD-10-CM

## 2023-01-21 DIAGNOSIS — I129 Hypertensive chronic kidney disease with stage 1 through stage 4 chronic kidney disease, or unspecified chronic kidney disease: Secondary | ICD-10-CM

## 2023-01-21 DIAGNOSIS — N184 Chronic kidney disease, stage 4 (severe): Secondary | ICD-10-CM

## 2023-02-03 ENCOUNTER — Ambulatory Visit
Admission: RE | Admit: 2023-02-03 | Discharge: 2023-02-03 | Disposition: A | Discharge status: Home / Self Care | Payer: Managed Care, Other (non HMO) | Source: Ambulatory Visit | Attending: Nephrology | Admitting: Nephrology

## 2023-02-03 DIAGNOSIS — I129 Hypertensive chronic kidney disease with stage 1 through stage 4 chronic kidney disease, or unspecified chronic kidney disease: Secondary | ICD-10-CM | POA: Insufficient documentation

## 2023-02-03 DIAGNOSIS — E875 Hyperkalemia: Secondary | ICD-10-CM | POA: Diagnosis present

## 2023-02-03 DIAGNOSIS — N184 Chronic kidney disease, stage 4 (severe): Secondary | ICD-10-CM | POA: Insufficient documentation

## 2023-04-24 ENCOUNTER — Ambulatory Visit (INDEPENDENT_AMBULATORY_CARE_PROVIDER_SITE_OTHER): Payer: Managed Care, Other (non HMO)

## 2023-04-24 DIAGNOSIS — Z86010 Personal history of colonic polyps: Secondary | ICD-10-CM

## 2023-04-24 DIAGNOSIS — D120 Benign neoplasm of cecum: Secondary | ICD-10-CM

## 2023-04-24 DIAGNOSIS — Z1211 Encounter for screening for malignant neoplasm of colon: Secondary | ICD-10-CM | POA: Diagnosis not present

## 2023-04-24 DIAGNOSIS — D122 Benign neoplasm of ascending colon: Secondary | ICD-10-CM

## 2023-04-24 DIAGNOSIS — D123 Benign neoplasm of transverse colon: Secondary | ICD-10-CM

## 2023-05-12 ENCOUNTER — Encounter: Payer: Self-pay | Admitting: *Deleted

## 2023-05-13 ENCOUNTER — Other Ambulatory Visit: Payer: Self-pay

## 2023-05-13 ENCOUNTER — Ambulatory Visit: Payer: Managed Care, Other (non HMO) | Admitting: Certified Registered"

## 2023-05-13 ENCOUNTER — Encounter: Payer: Self-pay | Admitting: *Deleted

## 2023-05-13 ENCOUNTER — Ambulatory Visit
Admission: RE | Admit: 2023-05-13 | Discharge: 2023-05-13 | Disposition: A | Discharge status: Home / Self Care | Payer: Managed Care, Other (non HMO) | Attending: Gastroenterology | Admitting: Gastroenterology

## 2023-05-13 ENCOUNTER — Encounter
Admission: RE | Disposition: A | Discharge status: Home / Self Care | Payer: Self-pay | Source: Home / Self Care | Attending: Gastroenterology

## 2023-05-13 DIAGNOSIS — D124 Benign neoplasm of descending colon: Secondary | ICD-10-CM | POA: Insufficient documentation

## 2023-05-13 DIAGNOSIS — Z86010 Personal history of colonic polyps: Secondary | ICD-10-CM | POA: Diagnosis not present

## 2023-05-13 DIAGNOSIS — K5730 Diverticulosis of large intestine without perforation or abscess without bleeding: Secondary | ICD-10-CM | POA: Diagnosis not present

## 2023-05-13 DIAGNOSIS — I129 Hypertensive chronic kidney disease with stage 1 through stage 4 chronic kidney disease, or unspecified chronic kidney disease: Secondary | ICD-10-CM | POA: Diagnosis not present

## 2023-05-13 DIAGNOSIS — N189 Chronic kidney disease, unspecified: Secondary | ICD-10-CM | POA: Diagnosis not present

## 2023-05-13 DIAGNOSIS — D128 Benign neoplasm of rectum: Secondary | ICD-10-CM | POA: Diagnosis not present

## 2023-05-13 DIAGNOSIS — K640 First degree hemorrhoids: Secondary | ICD-10-CM | POA: Diagnosis not present

## 2023-05-13 DIAGNOSIS — K635 Polyp of colon: Secondary | ICD-10-CM | POA: Diagnosis present

## 2023-05-13 DIAGNOSIS — D122 Benign neoplasm of ascending colon: Secondary | ICD-10-CM | POA: Insufficient documentation

## 2023-05-13 HISTORY — PX: COLONOSCOPY WITH PROPOFOL: SHX5780

## 2023-05-13 SURGERY — COLONOSCOPY WITH PROPOFOL
Anesthesia: General

## 2023-05-13 MED ORDER — PROPOFOL 10 MG/ML IV BOLUS
INTRAVENOUS | Status: AC
  Filled 2023-05-13: qty 20

## 2023-05-13 MED ORDER — SPOT INK MARKER SYRINGE KIT
PACK | SUBMUCOSAL | Status: DC | PRN
  Administered 2023-05-13: 1 mL via SUBMUCOSAL

## 2023-05-13 MED ORDER — ONDANSETRON HCL 4 MG/2ML IJ SOLN
INTRAMUSCULAR | Status: DC | PRN
  Administered 2023-05-13: 4 mg via INTRAVENOUS

## 2023-05-13 MED ORDER — PROPOFOL 500 MG/50ML IV EMUL
INTRAVENOUS | Status: DC | PRN
  Administered 2023-05-13: 20 mg via INTRAVENOUS
  Administered 2023-05-13: 150 ug/kg/min via INTRAVENOUS
  Administered 2023-05-13: 100 mg via INTRAVENOUS
  Administered 2023-05-13: 30 mg via INTRAVENOUS

## 2023-05-13 MED ORDER — SODIUM CHLORIDE 0.9 % IV SOLN: INTRAVENOUS | Status: DC

## 2023-05-13 MED ORDER — SODIUM CHLORIDE 0.9 % IV SOLN: INTRAVENOUS | Status: DC | PRN

## 2023-05-13 NOTE — Transfer of Care (Signed)
Immediate Anesthesia Transfer of Care Note  Patient: Ricky Simmons  Procedure(s) Performed: COLONOSCOPY WITH PROPOFOL  Patient Location: PACU  Anesthesia Type:MAC  Level of Consciousness: drowsy  Airway & Oxygen Therapy: Patient Spontanous Breathing  Post-op Assessment: Report given to RN  Post vital signs: Reviewed  Last Vitals:  Vitals Value Taken Time  BP 127/79 05/13/23 1430  Temp 12F   Pulse 67 05/13/23 1431  Resp 15 05/13/23 1431  SpO2 99 % 05/13/23 1431  Vitals shown include unvalidated device data.  Last Pain:  Vitals:   05/13/23 1430  TempSrc:   PainSc: 0-No pain         Complications: No notable events documented. Pt scratched his eyes.

## 2023-05-13 NOTE — Anesthesia Postprocedure Evaluation (Signed)
Anesthesia Post Note  Patient: Ricky Simmons  Procedure(s) Performed: COLONOSCOPY WITH PROPOFOL  Patient location during evaluation: Endoscopy Anesthesia Type: General Level of consciousness: awake and alert Pain management: pain level controlled Vital Signs Assessment: post-procedure vital signs reviewed and stable Respiratory status: spontaneous breathing, nonlabored ventilation, respiratory function stable and patient connected to nasal cannula oxygen Cardiovascular status: blood pressure returned to baseline and stable Postop Assessment: no apparent nausea or vomiting Anesthetic complications: no   No notable events documented.   Last Vitals:  Vitals:   05/13/23 1440 05/13/23 1450  BP: (!) 152/92 (!) 155/91  Pulse:    Resp:    Temp:    SpO2:      Last Pain:  Vitals:   05/13/23 1450  TempSrc:   PainSc: 0-No pain                 Corinda Gubler

## 2023-05-13 NOTE — Op Note (Addendum)
[facility] Patient Name: Ricky Simmons Procedure Date: 05/13/2023 1:04 PM MRN: [id number] Account #: [id number] Date of Birth: 23-Dec-1960 Admit Type: Outpatient Age: 62 Room: [facility] ENDO ROOM 1 Gender: Male Note Status: Supervisor Override Instrument Name: Peds Colonoscope [id number] Procedure:             Colonoscopy Indications:           Personal history of colonic polyps Providers:             Eather Colas MD, MD Referring MD:          Marilynne Halsted, MD (Referring MD) Medicines:             Monitored Anesthesia Care Complications:         No immediate complications. Estimated blood loss:                         Minimal. Procedure:             Pre-Anesthesia Assessment:                        - Prior to the procedure, a History and Physical was                         performed, and patient medications and allergies were                         reviewed. The patient is competent. The risks and                         benefits of the procedure and the sedation options and                         risks were discussed with the patient. All questions                         were answered and informed consent was obtained.                         Patient identification and proposed procedure were                         verified by the physician, the nurse, the                         anesthesiologist, the anesthetist and the technician                         in the endoscopy suite. Mental Status Examination:                         alert and oriented. Airway Examination: normal                         oropharyngeal airway and neck mobility. Respiratory                         Examination: clear to auscultation. CV Examination:  normal. Prophylactic Antibiotics: The patient does not                         require prophylactic antibiotics. Prior                         Anticoagulants: The patient has taken no  anticoagulant                         or antiplatelet agents. ASA Grade Assessment: III - A                         patient with severe systemic disease. After reviewing                         the risks and benefits, the patient was deemed in                         satisfactory condition to undergo the procedure. The                         anesthesia plan was to use monitored anesthesia care                         (MAC). Immediately prior to administration of                         medications, the patient was re-assessed for adequacy                         to receive sedatives. The heart rate, respiratory                         rate, oxygen saturations, blood pressure, adequacy of                         pulmonary ventilation, and response to care were                         monitored throughout the procedure. The physical                         status of the patient was re-assessed after the                         procedure.                        After obtaining informed consent, the colonoscope was                         passed under direct vision. Throughout the procedure,                         the patient's blood pressure, pulse, and oxygen                         saturations were monitored continuously. The  Colonoscope was introduced through the anus and                         advanced to the the cecum, identified by appendiceal                         orifice and ileocecal valve. The colonoscopy was                         technically difficult and complex due to a redundant                         colon, significant looping and a tortuous colon. The                         patient tolerated the procedure well. The quality of                         the bowel preparation was adequate to identify polyps.                         The ileocecal valve, appendiceal orifice, and rectum                         were photographed. Findings:      The  perianal and digital rectal examinations were normal.      A 25 mm polyp was found in the ileocecal valve. The polyp was       semi-pedunculated. The polyp was removed with a piecemeal technique       using a hot snare. Resection and retrieval were complete. To prevent       bleeding post-intervention, one hemostatic clip was successfully placed.       There was no bleeding during, or at the end, of the procedure.      A 20 mm polyp was found in the ascending colon. The polyp was       semi-sessile. The polyp was removed with a piecemeal technique using a       hot snare. Resection and retrieval were complete. To prevent bleeding       post-intervention, one hemostatic clip was successfully placed. There       was no bleeding during, or at the end, of the procedure. Area was       tattooed with an injection of [place] ink.      A 5 mm polyp was found in the ascending colon. The polyp was sessile.       The polyp was removed with a cold snare. Resection was complete, but the       polyp tissue was not retrieved. Estimated blood loss was minimal.      A 12 mm polyp was found in the descending colon. The polyp was       pedunculated. The polyp was removed with a hot snare. Resection and       retrieval were complete. To prevent bleeding post-intervention, one       hemostatic clip was successfully placed. There was no bleeding during,       or at the end, of the procedure.      A 4 mm polyp was found in the rectum. The polyp was sessile. The  polyp       was removed with a cold snare. Resection and retrieval were complete.       Estimated blood loss was minimal.      Many large-mouthed and small-mouthed diverticula were found in the       sigmoid colon and descending colon.      Internal hemorrhoids were found during retroflexion. The hemorrhoids       were Grade I (internal hemorrhoids that do not prolapse).      The exam was otherwise without abnormality on direct and retroflexion        views. There were other scattered smaller polyps that were not removed. Impression:            - One 25 mm polyp at the ileocecal valve, removed                         piecemeal using a hot snare. Resected and retrieved.                         Clip was placed.                        - One 20 mm polyp in the ascending colon, removed                         piecemeal using a hot snare. Resected and retrieved.                         Clip was placed. Tattooed.                        - One 5 mm polyp in the ascending colon, removed with                         a cold snare. Complete resection. Polyp tissue not                         retrieved.                        - One 12 mm polyp in the descending colon, removed                         with a hot snare. Resected and retrieved. Clip was                         placed.                        - One 4 mm polyp in the rectum, removed with a cold                         snare. Resected and retrieved.                        - Diverticulosis in the sigmoid colon and in the                         descending colon.                        -  Internal hemorrhoids.                        - The examination was otherwise normal on direct and                         retroflexion views. Recommendation:        - Discharge patient to home.                        - Resume previous diet.                        - Continue present medications.                        - Await pathology results.                        - Repeat colonoscopy in 3-6 months for surveillance                         after piecemeal polypectomy and removal of other                         smaller polyps.                        - Return to referring physician as previously                         scheduled. Procedure Code(s):     --- Professional ---                        [phone], Colonoscopy, flexible; with removal of                         tumor(s), polyp(s), or other lesion(s) by  snare                         technique                        [id number], Colonoscopy, flexible; with directed submucosal                         injection(s), any substance Diagnosis Code(s):     --- Professional ---                        D12.0, Benign neoplasm of cecum                        D12.2, Benign neoplasm of ascending colon                        D12.4, Benign neoplasm of descending colon                        D12.8, Benign neoplasm of rectum                        K64.0, First degree hemorrhoids  K63.5, Polyp of colon                        K57.30, Diverticulosis of large intestine without                         perforation or abscess without bleeding CPT copyright 2022 American Medical Association. All rights reserved. The codes documented in this report are preliminary and upon coder review may  be revised to meet current compliance requirements. Eather Colas MD, MD 05/13/2023 2:40:29 PM Number of Addenda: 0 Note Initiated On: 05/13/2023 1:04 PM Scope Withdrawal Time: 0 hours 51 minutes 57 seconds  Total Procedure Duration: 1 hour 2 minutes 13 seconds  Estimated Blood Loss:  Estimated blood loss was minimal.      [facility]

## 2023-05-13 NOTE — H&P (Signed)
Outpatient short stay form Pre-procedure 05/13/2023  Regis Bill, MD  Primary Physician: Patrice Paradise, MD  Reason for visit:  Known polyp of colon  History of present illness:    62 y/o gentleman with CKD, hypertension, and known colon polyp here for removal of known colon polyps found on colonoscopy a few weeks ago. No blood thinners. Last colonoscopy was in 2014 with an adenomatous polyp. No significant abdominal surgeries.    Current Facility-Administered Medications:    0.9 %  sodium chloride infusion, , Intravenous, Continuous, Arrielle Mcginn, Rossie Muskrat, MD, Last Rate: 20 mL/hr at 05/13/23 1229, New Bag at 05/13/23 1229  Facility-Administered Medications Prior to Admission  Medication Dose Route Frequency Provider Last Rate Last Admin   cephALEXin (KEFLEX) capsule 500 mg  500 mg Oral Once Sondra Come, MD       Medications Prior to Admission  Medication Sig Dispense Refill Last Dose   acetaminophen (TYLENOL) 500 MG tablet Take 1,000 mg by mouth every 6 (six) hours as needed (for pain/headaches.).   05/12/2023   amLODipine (NORVASC) 10 MG tablet Take 10 mg by mouth every morning.   1 05/13/2023 at 0930   carvedilol (COREG) 25 MG tablet Take 25 mg by mouth 2 (two) times daily.  1 05/13/2023   hydrALAZINE (APRESOLINE) 50 MG tablet Take 50 mg by mouth 2 (two) times daily.  2 05/13/2023 at 0930     No Known Allergies   Past Medical History:  Diagnosis Date   Chronic kidney disease    STAGE 3-FOLLOWED BY PCP   GERD (gastroesophageal reflux disease)    RARE   History of kidney stones    H/O   Hypertension    Penile fracture 1989    Review of systems:  Otherwise negative.    Physical Exam  Gen: Alert, oriented. Appears stated age.  HEENT: PERRLA. Lungs: No respiratory distress CV: RRR Abd: soft, benign, no masses Ext: No edema    Planned procedures: Proceed with colonoscopy. The patient understands the nature of the planned procedure, indications, risks,  alternatives and potential complications including but not limited to bleeding, infection, perforation, damage to internal organs and possible oversedation/side effects from anesthesia. The patient agrees and gives consent to proceed.  Please refer to procedure notes for findings, recommendations and patient disposition/instructions.     Regis Bill, MD [facility]

## 2023-05-13 NOTE — Interval H&P Note (Signed)
History and Physical Interval Note:  05/13/2023 1:09 PM  Ricky Simmons  has presented today for surgery, with the diagnosis of PH Colon Polyps.  The various methods of treatment have been discussed with the patient and family. After consideration of risks, benefits and other options for treatment, the patient has consented to  Procedure(s): COLONOSCOPY WITH PROPOFOL (N/A) as a surgical intervention.  The patient's history has been reviewed, patient examined, no change in status, stable for surgery.  I have reviewed the patient's chart and labs.  Questions were answered to the patient's satisfaction.     Regis Bill  Ok to proceed with colonoscopy

## 2023-05-13 NOTE — Anesthesia Preprocedure Evaluation (Signed)
Anesthesia Evaluation  Patient identified by MRN, date of birth, ID band Patient awake    Reviewed: Allergy & Precautions, NPO status , Patient's Chart, lab work & pertinent test results  History of Anesthesia Complications Negative for: history of anesthetic complications  Airway Mallampati: II  TM Distance: >3 FB Neck ROM: Full    Dental no notable dental hx. (+) Teeth Intact   Pulmonary neg sleep apnea, neg COPD, Current Smoker and Patient abstained from smoking.   Pulmonary exam normal breath sounds clear to auscultation       Cardiovascular Exercise Tolerance: Good METShypertension, +CHF and + DOE  (-) CAD and (-) Past MI (-) dysrhythmias  Rhythm:Regular Rate:Normal - Systolic murmurs TTE 2023: INTERPRETATION  MILD LV SYSTOLIC DYSFUNCTION WITH AN ESTIMATED EF = 45-50 %  NORMAL RIGHT VENTRICULAR SYSTOLIC FUNCTION  MODERATE MITRAL VALVE INSUFFICIENCY  MILD TRICUSPID VALVE INSUFFICIENCY  TRACE AORTIC VALVE INSUFFICIENCY  NO VALVULAR STENOSIS  MILD LV ENLARGEMENT  MILD RV ENLARGEMENT  MILD BIATRIAL ENLARGEMENT  MODERATE LVH  DILATED AORTIC ROOT MEASURING 4.0 CM  INCIDENTAL FINDING:  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 (AP) x 3.18 cm (TRV)    Stress test without evidence of ischemia   Neuro/Psych negative neurological ROS  negative psych ROS   GI/Hepatic ,GERD  Controlled,,(+)     (-) substance abuse    Endo/Other  neg diabetes    Renal/GU CRFRenal diseaseIn middle of transplant workup     Musculoskeletal   Abdominal   Peds  Hematology   Anesthesia Other Findings Past Medical History: No date: Chronic kidney disease     Comment:  STAGE 3-FOLLOWED BY PCP No date: GERD (gastroesophageal reflux disease)     Comment:  RARE No date: History of kidney stones     Comment:  H/O No date: Hypertension 1989: Penile fracture  Reproductive/Obstetrics                              Anesthesia Physical Anesthesia Plan  ASA: 3  Anesthesia Plan: General   Post-op Pain Management: Minimal or no pain anticipated   Induction: Intravenous  PONV Risk Score and Plan: 1 and Propofol infusion, TIVA and Ondansetron  Airway Management Planned: Nasal Cannula  Additional Equipment: None  Intra-op Plan:   Post-operative Plan:   Informed Consent: I have reviewed the patients History and Physical, chart, labs and discussed the procedure including the risks, benefits and alternatives for the proposed anesthesia with the patient or authorized representative who has indicated his/her understanding and acceptance.     Dental advisory given  Plan Discussed with: CRNA and Surgeon  Anesthesia Plan Comments: (Discussed risks of anesthesia with patient, including possibility of difficulty with spontaneous ventilation under anesthesia necessitating airway intervention, PONV, and rare risks such as cardiac or respiratory or neurological events, and allergic reactions. Discussed the role of CRNA in patient's perioperative care. Patient understands.)       Anesthesia Quick Evaluation

## 2023-05-14 ENCOUNTER — Encounter: Payer: Self-pay | Admitting: Gastroenterology

## 2023-07-04 ENCOUNTER — Encounter: Payer: Self-pay | Admitting: Gastroenterology

## 2023-09-11 ENCOUNTER — Ambulatory Visit: Payer: Managed Care, Other (non HMO) | Admitting: Urology

## 2023-09-11 ENCOUNTER — Encounter: Payer: Self-pay | Admitting: Urology

## 2023-10-21 ENCOUNTER — Encounter: Payer: Self-pay | Admitting: *Deleted

## 2023-11-03 ENCOUNTER — Encounter: Payer: Self-pay | Admitting: *Deleted

## 2023-11-11 ENCOUNTER — Ambulatory Visit: Payer: Managed Care, Other (non HMO) | Admitting: Anesthesiology

## 2023-11-11 ENCOUNTER — Other Ambulatory Visit: Payer: Self-pay

## 2023-11-11 ENCOUNTER — Encounter: Payer: Self-pay | Admitting: *Deleted

## 2023-11-11 ENCOUNTER — Encounter
Admission: RE | Disposition: A | Discharge status: Home / Self Care | Payer: Self-pay | Source: Home / Self Care | Attending: Gastroenterology

## 2023-11-11 ENCOUNTER — Ambulatory Visit
Admission: RE | Admit: 2023-11-11 | Discharge: 2023-11-11 | Disposition: A | Discharge status: Home / Self Care | Payer: Managed Care, Other (non HMO) | Attending: Gastroenterology | Admitting: Gastroenterology

## 2023-11-11 DIAGNOSIS — K219 Gastro-esophageal reflux disease without esophagitis: Secondary | ICD-10-CM | POA: Diagnosis not present

## 2023-11-11 DIAGNOSIS — Z992 Dependence on renal dialysis: Secondary | ICD-10-CM | POA: Diagnosis not present

## 2023-11-11 DIAGNOSIS — D124 Benign neoplasm of descending colon: Secondary | ICD-10-CM | POA: Insufficient documentation

## 2023-11-11 DIAGNOSIS — I509 Heart failure, unspecified: Secondary | ICD-10-CM | POA: Diagnosis not present

## 2023-11-11 DIAGNOSIS — K5730 Diverticulosis of large intestine without perforation or abscess without bleeding: Secondary | ICD-10-CM | POA: Insufficient documentation

## 2023-11-11 DIAGNOSIS — Z09 Encounter for follow-up examination after completed treatment for conditions other than malignant neoplasm: Secondary | ICD-10-CM | POA: Diagnosis not present

## 2023-11-11 DIAGNOSIS — Z1211 Encounter for screening for malignant neoplasm of colon: Secondary | ICD-10-CM | POA: Insufficient documentation

## 2023-11-11 DIAGNOSIS — N186 End stage renal disease: Secondary | ICD-10-CM | POA: Diagnosis not present

## 2023-11-11 DIAGNOSIS — D120 Benign neoplasm of cecum: Secondary | ICD-10-CM | POA: Insufficient documentation

## 2023-11-11 DIAGNOSIS — D125 Benign neoplasm of sigmoid colon: Secondary | ICD-10-CM | POA: Diagnosis not present

## 2023-11-11 DIAGNOSIS — D123 Benign neoplasm of transverse colon: Secondary | ICD-10-CM | POA: Diagnosis not present

## 2023-11-11 DIAGNOSIS — K641 Second degree hemorrhoids: Secondary | ICD-10-CM | POA: Insufficient documentation

## 2023-11-11 DIAGNOSIS — D122 Benign neoplasm of ascending colon: Secondary | ICD-10-CM | POA: Diagnosis not present

## 2023-11-11 DIAGNOSIS — I132 Hypertensive heart and chronic kidney disease with heart failure and with stage 5 chronic kidney disease, or end stage renal disease: Secondary | ICD-10-CM | POA: Diagnosis not present

## 2023-11-11 DIAGNOSIS — Z860101 Personal history of adenomatous and serrated colon polyps: Secondary | ICD-10-CM | POA: Diagnosis not present

## 2023-11-11 HISTORY — DX: Thrombotic microangiopathy, unspecified: M31.10

## 2023-11-11 HISTORY — DX: Hyperkalemia: E87.5

## 2023-11-11 HISTORY — DX: Other specified symptoms and signs involving the circulatory and respiratory systems: R09.89

## 2023-11-11 HISTORY — DX: Hyperlipidemia, unspecified: E78.5

## 2023-11-11 HISTORY — DX: Persistent proteinuria, unspecified: R80.1

## 2023-11-11 HISTORY — PX: POLYPECTOMY: SHX5525

## 2023-11-11 HISTORY — DX: Secondary hyperparathyroidism of renal origin: N25.81

## 2023-11-11 HISTORY — DX: Dependence on renal dialysis: Z99.2

## 2023-11-11 HISTORY — PX: HEMOSTASIS CLIP PLACEMENT: SHX6857

## 2023-11-11 HISTORY — PX: COLONOSCOPY WITH PROPOFOL: SHX5780

## 2023-11-11 SURGERY — COLONOSCOPY WITH PROPOFOL
Anesthesia: General

## 2023-11-11 MED ORDER — GLYCOPYRROLATE 0.2 MG/ML IJ SOLN
INTRAMUSCULAR | Status: DC | PRN
  Administered 2023-11-11: .2 mg via INTRAVENOUS

## 2023-11-11 MED ORDER — PROPOFOL 500 MG/50ML IV EMUL
INTRAVENOUS | Status: DC | PRN
  Administered 2023-11-11: 175 ug/kg/min via INTRAVENOUS

## 2023-11-11 MED ORDER — PHENYLEPHRINE 80 MCG/ML (10ML) SYRINGE FOR IV PUSH (FOR BLOOD PRESSURE SUPPORT)
PREFILLED_SYRINGE | INTRAVENOUS | Status: DC | PRN
  Administered 2023-11-11 (×3): 80 ug via INTRAVENOUS

## 2023-11-11 MED ORDER — PROPOFOL 10 MG/ML IV BOLUS
INTRAVENOUS | Status: DC | PRN
  Administered 2023-11-11: 20 mg via INTRAVENOUS
  Administered 2023-11-11: 60 mg via INTRAVENOUS
  Administered 2023-11-11: 20 mg via INTRAVENOUS
  Administered 2023-11-11: 30 mg via INTRAVENOUS

## 2023-11-11 MED ORDER — DEXMEDETOMIDINE HCL IN NACL 200 MCG/50ML IV SOLN
INTRAVENOUS | Status: DC | PRN
  Administered 2023-11-11: 8 ug via INTRAVENOUS

## 2023-11-11 MED ORDER — LIDOCAINE HCL (CARDIAC) PF 100 MG/5ML IV SOSY
PREFILLED_SYRINGE | INTRAVENOUS | Status: DC | PRN
  Administered 2023-11-11: 100 mg via INTRAVENOUS

## 2023-11-11 MED ORDER — SODIUM CHLORIDE 0.9 % IV SOLN: INTRAVENOUS | Status: DC

## 2023-11-11 NOTE — Anesthesia Preprocedure Evaluation (Addendum)
Anesthesia Evaluation  Patient identified by MRN, date of birth, ID band Patient awake    Reviewed: Allergy & Precautions, NPO status , Patient's Chart, lab work & pertinent test results  History of Anesthesia Complications Negative for: history of anesthetic complications  Airway Mallampati: I   Neck ROM: Full    Dental  (+) Missing   Pulmonary former smoker (quit 08/2023)   Pulmonary exam normal breath sounds clear to auscultation       Cardiovascular hypertension, +CHF  Normal cardiovascular exam Rhythm:Regular Rate:Normal  Myocardial perfusion 10/29/23:  - Myocardial perfusion imaging is normal. No evidence of regadenoson- inducible ischemia.   - Normal left ventricular systolic function. Dilated left ventricle.   ECG 10/07/23:  Normal sinus rhythm  Right bundle branch block      Neuro/Psych negative neurological ROS     GI/Hepatic ,GERD  ,,  Endo/Other  negative endocrine ROS    Renal/GU ESRF and DialysisRenal disease (nephrolithiasis; last PD 11/10/23)     Musculoskeletal   Abdominal   Peds  Hematology negative hematology ROS (+)   Anesthesia Other Findings   Reproductive/Obstetrics                             Anesthesia Physical Anesthesia Plan  ASA: 4  Anesthesia Plan: General   Post-op Pain Management:    Induction: Intravenous  PONV Risk Score and Plan: 2 and Propofol infusion, TIVA and Treatment may vary due to age or medical condition  Airway Management Planned: Natural Airway  Additional Equipment:   Intra-op Plan:   Post-operative Plan:   Informed Consent: I have reviewed the patients History and Physical, chart, labs and discussed the procedure including the risks, benefits and alternatives for the proposed anesthesia with the patient or authorized representative who has indicated his/her understanding and acceptance.       Plan Discussed with:  CRNA  Anesthesia Plan Comments: (LMA/GETA backup discussed.  Patient consented for risks of anesthesia including but not limited to:  - adverse reactions to medications - damage to eyes, teeth, lips or other oral mucosa - nerve damage due to positioning  - sore throat or hoarseness - damage to heart, brain, nerves, lungs, other parts of body or loss of life  Informed patient about role of CRNA in peri- and intra-operative care.  Patient voiced understanding.)        Anesthesia Quick Evaluation

## 2023-11-11 NOTE — Interval H&P Note (Signed)
History and Physical Interval Note:  11/11/2023 12:18 PM  Ricky Simmons  has presented today for surgery, with the diagnosis of PH Colon Polyps.  The various methods of treatment have been discussed with the patient and family. After consideration of risks, benefits and other options for treatment, the patient has consented to  Procedure(s): COLONOSCOPY WITH PROPOFOL (N/A) as a surgical intervention.  The patient's history has been reviewed, patient examined, no change in status, stable for surgery.  I have reviewed the patient's chart and labs.  Questions were answered to the patient's satisfaction.     Regis Bill  Ok to proceed with colonoscopy

## 2023-11-11 NOTE — Transfer of Care (Signed)
Immediate Anesthesia Transfer of Care Note  Patient: Ricky Simmons  Procedure(s) Performed: COLONOSCOPY WITH PROPOFOL POLYPECTOMY HEMOSTASIS CLIP PLACEMENT  Patient Location: Endoscopy Unit  Anesthesia Type:General  Level of Consciousness: drowsy and patient cooperative  Airway & Oxygen Therapy: Patient Spontanous Breathing and Patient connected to face mask oxygen  Post-op Assessment: Report given to RN and Post -op Vital signs reviewed and stable  Post vital signs: Reviewed and stable  Last Vitals:  Vitals Value Taken Time  BP    Temp    Pulse    Resp    SpO2      Last Pain:  Vitals:   11/11/23 1151  TempSrc: Temporal  PainSc: 0-No pain         Complications: No notable events documented.

## 2023-11-11 NOTE — H&P (Signed)
Outpatient short stay form Pre-procedure 11/11/2023  Ricky Bill, MD  Primary Physician: Patrice Paradise, MD  Reason for visit:  Surveillance colonoscopy  History of present illness:    62 y/o gentleman with CKD on PD dialysis and hypertension here for f/u colonoscopy after piecemeal resection of a few large polyps. Last colonoscopy about 6 months ago. No blood thinners. History of PD placement, has taken prophylactic antibiotics.    Current Facility-Administered Medications:    0.9 %  sodium chloride infusion, , Intravenous, Continuous, Cyd Hostler, Rossie Muskrat, MD  Facility-Administered Medications Prior to Admission  Medication Dose Route Frequency Provider Last Rate Last Admin   cephALEXin (KEFLEX) capsule 500 mg  500 mg Oral Once Sondra Come, MD       Medications Prior to Admission  Medication Sig Dispense Refill Last Dose   acetaminophen (TYLENOL) 500 MG tablet Take 1,000 mg by mouth every 6 (six) hours as needed (for pain/headaches.).   11/10/2023   amLODipine (NORVASC) 10 MG tablet Take 10 mg by mouth every morning.   1 11/11/2023 at 0600   carvedilol (COREG) 25 MG tablet Take 25 mg by mouth 2 (two) times daily.  1 11/11/2023 at 0600   cholecalciferol (VITAMIN D3) 10 MCG (400 UNIT) TABS tablet Take 400 Units by mouth 2 (two) times daily.   11/10/2023   hydrALAZINE (APRESOLINE) 50 MG tablet Take 50 mg by mouth 2 (two) times daily.  2 11/10/2023     No Known Allergies   Past Medical History:  Diagnosis Date   Carotid bruit    Chronic kidney disease    STAGE 3-FOLLOWED BY PCP   GERD (gastroesophageal reflux disease)    RARE   History of kidney stones    H/O   Hyperkalemia    Hyperlipidemia    Hyperparathyroidism due to renal insufficiency (HCC)    Hypertension    Penile fracture 1989   Peritoneal dialysis catheter in situ (HCC)    Persistent proteinuria    Thrombotic microangiopathy (HCC)     Review of systems:  Otherwise negative.    Physical  Exam  Gen: Alert, oriented. Appears stated age.  HEENT: PERRLA. Lungs: No respiratory distress CV: RRR Abd: soft, benign, no masses Ext: No edema    Planned procedures: Proceed with colonoscopy. The patient understands the nature of the planned procedure, indications, risks, alternatives and potential complications including but not limited to bleeding, infection, perforation, damage to internal organs and possible oversedation/side effects from anesthesia. The patient agrees and gives consent to proceed.  Please refer to procedure notes for findings, recommendations and patient disposition/instructions.     Ricky Bill, MD [facility]

## 2023-11-11 NOTE — Op Note (Signed)
[facility] Gastroenterology Patient Name: Ricky Simmons Procedure Date: 11/11/2023 12:11 PM MRN: [id number] Account #: [id number] Date of Birth: 1961-08-03 Admit Type: Outpatient Age: 62 Room: [facility] ENDO ROOM 1 Gender: Male Note Status: Finalized Instrument Name: Prentice Docker [id number] Procedure:             Colonoscopy Indications:           Surveillance: Personal history of piecemeal removal of                         adenoma on last colonoscopy 6 months ago Providers:             Eather Colas MD, MD Medicines:             Monitored Anesthesia Care Complications:         No immediate complications. Estimated blood loss:                         Minimal. Procedure:             Pre-Anesthesia Assessment:                        - Prior to the procedure, a History and Physical was                         performed, and patient medications and allergies were                         reviewed. The patient is competent. The risks and                         benefits of the procedure and the sedation options and                         risks were discussed with the patient. All questions                         were answered and informed consent was obtained.                         Patient identification and proposed procedure were                         verified by the physician, the nurse, the                         anesthesiologist, the anesthetist and the technician                         in the endoscopy suite. Mental Status Examination:                         alert and oriented. Airway Examination: normal                         oropharyngeal airway and neck mobility. Respiratory                         Examination: clear to auscultation. CV Examination:  normal. Prophylactic Antibiotics: The patient does not                         require prophylactic antibiotics. Prior                         Anticoagulants: The patient has taken no  anticoagulant                         or antiplatelet agents. ASA Grade Assessment: III - A                         patient with severe systemic disease. After reviewing                         the risks and benefits, the patient was deemed in                         satisfactory condition to undergo the procedure. The                         anesthesia plan was to use monitored anesthesia care                         (MAC). Immediately prior to administration of                         medications, the patient was re-assessed for adequacy                         to receive sedatives. The heart rate, respiratory                         rate, oxygen saturations, blood pressure, adequacy of                         pulmonary ventilation, and response to care were                         monitored throughout the procedure. The physical                         status of the patient was re-assessed after the                         procedure.                        After obtaining informed consent, the colonoscope was                         passed under direct vision. Throughout the procedure,                         the patient's blood pressure, pulse, and oxygen                         saturations were monitored continuously. The  Colonoscope was introduced through the anus and                         advanced to the the cecum, identified by appendiceal                         orifice and ileocecal valve. The colonoscopy was                         somewhat difficult due to a tortuous colon. The                         patient tolerated the procedure well. The quality of                         the bowel preparation was fair. The ileocecal valve,                         appendiceal orifice, and rectum were photographed. Findings:      The perianal and digital rectal examinations were normal.      Two sessile polyps were found in the cecum. The polyps were 3 to 5 mm in        size. These polyps were removed with a cold snare. Resection and       retrieval were complete. Estimated blood loss was minimal.      A few large-mouthed and small-mouthed diverticula were found in the       cecum.      An 8 mm polyp was found in the ascending colon. This was residual polyp       from previous polypectomy as a clip was still in place. The polyp was       sessile. The polyp was removed with a hot snare. Resection and retrieval       were complete. To prevent bleeding after the polypectomy, two hemostatic       clips were successfully placed. There was no bleeding during, or at the       end, of the procedure.      A 3 mm polyp was found in the hepatic flexure. The polyp was sessile.       The polyp was removed with a cold snare. Resection and retrieval were       complete. Estimated blood loss was minimal.      A 5 mm polyp was found in the transverse colon. The polyp was sessile.       The polyp was removed with a cold snare. Resection and retrieval were       complete. Estimated blood loss was minimal.      A 10 mm polyp was found in the transverse colon. The polyp was       semi-pedunculated. The polyp was removed with a hot snare. Resection and       retrieval were complete. Estimated blood loss was minimal.      Many large-mouthed and small-mouthed diverticula were found in the       sigmoid colon and descending colon.      A 4 mm polyp was found in the descending colon. The polyp was sessile.       The polyp was removed with a cold snare. Resection and retrieval were  complete. Estimated blood loss was minimal.      A 5 mm polyp was found in the descending colon. The polyp was sessile.       The polyp was removed with a cold snare. Resection was complete, but the       polyp tissue was not retrieved. Estimated blood loss was minimal.      Two sessile polyps were found in the sigmoid colon. The polyps were 2 to       3 mm in size. These polyps were  removed with a cold snare. Resection and       retrieval were complete. Estimated blood loss was minimal.      Internal hemorrhoids were found during retroflexion. The hemorrhoids       were Grade II (internal hemorrhoids that prolapse but reduce       spontaneously).      The exam was otherwise without abnormality on direct and retroflexion       views. Impression:            - Preparation of the colon was fair.                        - Two 3 to 5 mm polyps in the cecum, removed with a                         cold snare. Resected and retrieved.                        - Diverticulosis in the cecum.                        - One 8 mm polyp in the ascending colon, removed with                         a hot snare. Resected and retrieved. Clips were placed.                        - One 3 mm polyp at the hepatic flexure, removed with                         a cold snare. Resected and retrieved.                        - One 5 mm polyp in the transverse colon, removed with                         a cold snare. Resected and retrieved.                        - One 10 mm polyp in the transverse colon, removed                         with a hot snare. Resected and retrieved.                        - Diverticulosis in the sigmoid colon and in the                         descending colon.                        -  One 4 mm polyp in the descending colon, removed with                         a cold snare. Resected and retrieved.                        - One 5 mm polyp in the descending colon, removed with                         a cold snare. Complete resection. Polyp tissue not                         retrieved.                        - Two 2 to 3 mm polyps in the sigmoid colon, removed                         with a cold snare. Resected and retrieved.                        - Internal hemorrhoids.                        - The examination was otherwise normal on direct and                          retroflexion views. Recommendation:        - Discharge patient to home.                        - Resume previous diet.                        - Continue present medications.                        - Await pathology results.                        - Repeat colonoscopy in 1 year for surveillance.                        - Return to referring physician as previously                         scheduled. Procedure Code(s):     --- Professional ---                        [id number], Colonoscopy, flexible; with removal of                         tumor(s), polyp(s), or other lesion(s) by snare                         technique Diagnosis Code(s):     --- Professional ---                        K64.1, Second degree hemorrhoids  D12.0, Benign neoplasm of cecum                        D12.5, Benign neoplasm of sigmoid colon                        D12.2, Benign neoplasm of ascending colon                        D12.3, Benign neoplasm of transverse colon (hepatic                         flexure or splenic flexure)                        D12.4, Benign neoplasm of descending colon                        Z09, Encounter for follow-up examination after                         completed treatment for conditions other than                         malignant neoplasm                        Z86.010, Personal history of colonic polyps                        K57.30, Diverticulosis of large intestine without                         perforation or abscess without bleeding CPT copyright 2022 [employer]. All rights reserved. The codes documented in this report are preliminary and upon coder review may  be revised to meet current compliance requirements. Eather Colas MD, MD 11/11/2023 1:19:15 PM Number of Addenda: 0 Note Initiated On: 11/11/2023 12:11 PM Scope Withdrawal Time: 0 hours 33 minutes 47 seconds  Total Procedure Duration: 0 hours 42 minutes 59 seconds  Estimated Blood  Loss:  Estimated blood loss was minimal.      [facility]

## 2023-11-11 NOTE — Anesthesia Procedure Notes (Signed)
Procedure Name: General with mask airway Date/Time: 11/11/2023 12:23 PM  Performed by: Mohammed Kindle, CRNAPre-anesthesia Checklist: Patient identified, Emergency Drugs available, Suction available and Patient being monitored Patient Re-evaluated:Patient Re-evaluated prior to induction Oxygen Delivery Method: Simple face mask Induction Type: IV induction Placement Confirmation: positive ETCO2, CO2 detector and breath sounds checked- equal and bilateral Dental Injury: Teeth and Oropharynx as per pre-operative assessment

## 2023-11-12 ENCOUNTER — Encounter: Payer: Self-pay | Admitting: Gastroenterology

## 2023-11-12 LAB — SURGICAL PATHOLOGY

## 2023-11-27 NOTE — Anesthesia Postprocedure Evaluation (Signed)
Anesthesia Post Note  Patient: Ricky Simmons  Procedure(s) Performed: COLONOSCOPY WITH PROPOFOL POLYPECTOMY HEMOSTASIS CLIP PLACEMENT  Patient location during evaluation: Endoscopy Anesthesia Type: General Level of consciousness: awake and alert Pain management: pain level controlled Vital Signs Assessment: post-procedure vital signs reviewed and stable Respiratory status: spontaneous breathing, nonlabored ventilation, respiratory function stable and patient connected to nasal cannula oxygen Cardiovascular status: blood pressure returned to baseline and stable Postop Assessment: no apparent nausea or vomiting Anesthetic complications: no   No notable events documented.   Last Vitals:  Vitals:   11/11/23 1311 11/11/23 1321  BP: 126/87 (!) 136/94  Pulse: 71 85  Resp: 20 17  Temp: (!) 36.2 C   SpO2: 100% 99%    Last Pain:  Vitals:   11/12/23 0832  TempSrc:   PainSc: 0-No pain                 Lenard Simmer
